# Patient Record
Sex: Female | Born: 1975 | Race: Black or African American | Hispanic: No | Marital: Single | Smoking: Never smoker
Health system: Southern US, Community
[De-identification: ages and names within clinical notes are randomized; demographics above are authoritative.]

## PROBLEM LIST (undated history)

## (undated) ENCOUNTER — Inpatient Hospital Stay (HOSPITAL_COMMUNITY): Payer: Self-pay

## (undated) DIAGNOSIS — G8929 Other chronic pain: Secondary | ICD-10-CM

## (undated) DIAGNOSIS — G43909 Migraine, unspecified, not intractable, without status migrainosus: Secondary | ICD-10-CM

## (undated) DIAGNOSIS — J40 Bronchitis, not specified as acute or chronic: Secondary | ICD-10-CM

## (undated) DIAGNOSIS — E119 Type 2 diabetes mellitus without complications: Secondary | ICD-10-CM

## (undated) DIAGNOSIS — N939 Abnormal uterine and vaginal bleeding, unspecified: Secondary | ICD-10-CM

## (undated) DIAGNOSIS — Z973 Presence of spectacles and contact lenses: Secondary | ICD-10-CM

## (undated) DIAGNOSIS — Z8759 Personal history of other complications of pregnancy, childbirth and the puerperium: Secondary | ICD-10-CM

## (undated) DIAGNOSIS — K219 Gastro-esophageal reflux disease without esophagitis: Secondary | ICD-10-CM

## (undated) HISTORY — PX: ESOPHAGOGASTRODUODENOSCOPY: SHX1529

## (undated) HISTORY — PX: HERNIA REPAIR: SHX51

---

## 1997-11-27 ENCOUNTER — Emergency Department (HOSPITAL_COMMUNITY): Admission: EM | Admit: 1997-11-27 | Discharge: 1997-11-27 | Payer: Self-pay | Admitting: Emergency Medicine

## 1997-12-01 ENCOUNTER — Encounter: Admission: RE | Admit: 1997-12-01 | Discharge: 1998-03-01 | Payer: Self-pay | Admitting: *Deleted

## 1998-01-04 ENCOUNTER — Emergency Department (HOSPITAL_COMMUNITY): Admission: EM | Admit: 1998-01-04 | Discharge: 1998-01-04 | Payer: Self-pay

## 1998-04-27 ENCOUNTER — Encounter
Admission: RE | Admit: 1998-04-27 | Discharge: 1998-05-14 | Payer: Self-pay | Admitting: Physical Medicine & Rehabilitation

## 2005-07-29 ENCOUNTER — Emergency Department (HOSPITAL_COMMUNITY): Admission: EM | Admit: 2005-07-29 | Discharge: 2005-07-29 | Payer: Self-pay | Admitting: Emergency Medicine

## 2006-01-02 HISTORY — PX: TOOTH EXTRACTION: SUR596

## 2008-08-07 ENCOUNTER — Inpatient Hospital Stay (HOSPITAL_COMMUNITY): Admission: AD | Admit: 2008-08-07 | Discharge: 2008-08-07 | Payer: Self-pay | Admitting: Obstetrics and Gynecology

## 2008-08-12 ENCOUNTER — Ambulatory Visit (HOSPITAL_COMMUNITY): Admission: AD | Admit: 2008-08-12 | Discharge: 2008-08-12 | Payer: Self-pay | Admitting: Obstetrics and Gynecology

## 2008-09-25 ENCOUNTER — Inpatient Hospital Stay (HOSPITAL_COMMUNITY): Admission: AD | Admit: 2008-09-25 | Discharge: 2008-09-25 | Payer: Self-pay | Admitting: Obstetrics and Gynecology

## 2008-10-12 ENCOUNTER — Inpatient Hospital Stay (HOSPITAL_COMMUNITY): Admission: AD | Admit: 2008-10-12 | Discharge: 2008-10-12 | Payer: Self-pay | Admitting: Obstetrics and Gynecology

## 2008-11-03 ENCOUNTER — Ambulatory Visit (HOSPITAL_COMMUNITY): Admission: RE | Admit: 2008-11-03 | Discharge: 2008-11-03 | Payer: Self-pay | Admitting: Obstetrics and Gynecology

## 2008-11-18 ENCOUNTER — Ambulatory Visit: Payer: Self-pay | Admitting: Physician Assistant

## 2008-11-18 ENCOUNTER — Inpatient Hospital Stay (HOSPITAL_COMMUNITY): Admission: AD | Admit: 2008-11-18 | Discharge: 2008-11-19 | Payer: Self-pay | Admitting: Obstetrics and Gynecology

## 2009-01-24 ENCOUNTER — Inpatient Hospital Stay (HOSPITAL_COMMUNITY): Admission: AD | Admit: 2009-01-24 | Discharge: 2009-01-24 | Payer: Self-pay | Admitting: Obstetrics and Gynecology

## 2009-02-26 ENCOUNTER — Inpatient Hospital Stay (HOSPITAL_COMMUNITY): Admission: RE | Admit: 2009-02-26 | Discharge: 2009-02-28 | Payer: Self-pay | Admitting: Obstetrics and Gynecology

## 2010-03-20 LAB — URINALYSIS, ROUTINE W REFLEX MICROSCOPIC
Nitrite: NEGATIVE
Protein, ur: NEGATIVE mg/dL
Urobilinogen, UA: 0.2 mg/dL (ref 0.0–1.0)
pH: 6 (ref 5.0–8.0)

## 2010-03-23 LAB — RPR: RPR Ser Ql: NONREACTIVE

## 2010-03-23 LAB — CBC
HCT: 40.3 % (ref 36.0–46.0)
MCHC: 32.5 g/dL (ref 30.0–36.0)
MCV: 81.2 fL (ref 78.0–100.0)
MCV: 82 fL (ref 78.0–100.0)
Platelets: 305 10*3/uL (ref 150–400)
RBC: 4.16 MIL/uL (ref 3.87–5.11)
RDW: 16.7 % — ABNORMAL HIGH (ref 11.5–15.5)
WBC: 12.1 10*3/uL — ABNORMAL HIGH (ref 4.0–10.5)

## 2010-03-23 LAB — BASIC METABOLIC PANEL
BUN: 5 mg/dL — ABNORMAL LOW (ref 6–23)
CO2: 22 mEq/L (ref 19–32)
Creatinine, Ser: 0.62 mg/dL (ref 0.4–1.2)
GFR calc Af Amer: >60 mL/min (ref >60)
Glucose, Bld: 185 mg/dL — ABNORMAL HIGH (ref 70–99)
Potassium: 3.5 mEq/L (ref 3.5–5.1)

## 2010-04-06 LAB — URINALYSIS, ROUTINE W REFLEX MICROSCOPIC
Glucose, UA: NEGATIVE mg/dL
Hgb urine dipstick: NEGATIVE
Ketones, ur: NEGATIVE mg/dL
Protein, ur: NEGATIVE mg/dL
Urobilinogen, UA: 0.2 mg/dL (ref 0.0–1.0)
pH: 5 (ref 5.0–8.0)

## 2010-04-06 LAB — FETAL FIBRONECTIN: Fetal Fibronectin: NEGATIVE

## 2010-04-07 LAB — DIFFERENTIAL
Basophils Absolute: 0.1 10*3/uL (ref 0.0–0.1)
Basophils Relative: 0 % (ref 0–1)
Eosinophils Absolute: 0.3 10*3/uL (ref 0.0–0.7)
Eosinophils Relative: 2 % (ref 0–5)
Monocytes Absolute: 0.8 10*3/uL (ref 0.1–1.0)

## 2010-04-07 LAB — URINALYSIS, ROUTINE W REFLEX MICROSCOPIC
Bilirubin Urine: NEGATIVE
Glucose, UA: NEGATIVE mg/dL
Hgb urine dipstick: NEGATIVE
Protein, ur: NEGATIVE mg/dL
Urobilinogen, UA: 0.2 mg/dL (ref 0.0–1.0)

## 2010-04-07 LAB — CBC
Hemoglobin: 12.3 g/dL (ref 12.0–15.0)
MCHC: 33.3 g/dL (ref 30.0–36.0)
MCV: 82.4 fL (ref 78.0–100.0)
RBC: 4.47 MIL/uL (ref 3.87–5.11)
RDW: 15 % (ref 11.5–15.5)

## 2010-04-07 LAB — COMPREHENSIVE METABOLIC PANEL
ALT: 29 U/L (ref 0–35)
AST: 23 U/L (ref 0–37)
Albumin: 3.2 g/dL — ABNORMAL LOW (ref 3.5–5.2)
Alkaline Phosphatase: 72 U/L (ref 39–117)
Creatinine, Ser: 0.59 mg/dL (ref 0.4–1.2)
GFR calc non Af Amer: >60 mL/min (ref >60)
Glucose, Bld: 90 mg/dL (ref 70–99)

## 2010-04-07 LAB — GC/CHLAMYDIA PROBE AMP, GENITAL
Chlamydia, DNA Probe: NEGATIVE
GC Probe Amp, Genital: NEGATIVE

## 2010-04-07 LAB — WET PREP, GENITAL
Trich, Wet Prep: NONE SEEN
Yeast Wet Prep HPF POC: NONE SEEN

## 2010-04-08 LAB — CBC
HCT: 35.4 % — ABNORMAL LOW (ref 36.0–46.0)
Hemoglobin: 11.9 g/dL — ABNORMAL LOW (ref 12.0–15.0)
MCHC: 33.4 g/dL (ref 30.0–36.0)
MCV: 83.5 fL (ref 78.0–100.0)
Platelets: 387 10*3/uL (ref 150–400)
RBC: 4.24 MIL/uL (ref 3.87–5.11)
RDW: 14.9 % (ref 11.5–15.5)
WBC: 15.8 10*3/uL — ABNORMAL HIGH (ref 4.0–10.5)

## 2010-04-08 LAB — COMPREHENSIVE METABOLIC PANEL
AST: 24 U/L (ref 0–37)
Albumin: 3.2 g/dL — ABNORMAL LOW (ref 3.5–5.2)
Alkaline Phosphatase: 64 U/L (ref 39–117)
BUN: 3 mg/dL — ABNORMAL LOW (ref 6–23)
GFR calc Af Amer: >60 mL/min (ref >60)
Potassium: 4.3 mEq/L (ref 3.5–5.1)
Total Bilirubin: 0.4 mg/dL (ref 0.3–1.2)
Total Protein: 5.9 g/dL — ABNORMAL LOW (ref 6.0–8.3)

## 2010-04-08 LAB — URINE CULTURE
Colony Count: NO GROWTH
Culture: NO GROWTH

## 2010-04-08 LAB — URINALYSIS, ROUTINE W REFLEX MICROSCOPIC
Ketones, ur: NEGATIVE mg/dL
Protein, ur: NEGATIVE mg/dL
Specific Gravity, Urine: 1.01 (ref 1.005–1.030)
Urobilinogen, UA: 0.2 mg/dL (ref 0.0–1.0)
pH: 7 (ref 5.0–8.0)

## 2010-04-08 LAB — WET PREP, GENITAL
Trich, Wet Prep: NONE SEEN
Yeast Wet Prep HPF POC: NONE SEEN

## 2010-04-08 LAB — URINE MICROSCOPIC-ADD ON

## 2010-04-10 LAB — URINALYSIS, ROUTINE W REFLEX MICROSCOPIC
Hgb urine dipstick: NEGATIVE
Nitrite: NEGATIVE
Protein, ur: NEGATIVE mg/dL
Urobilinogen, UA: 0.2 mg/dL (ref 0.0–1.0)

## 2010-04-10 LAB — GC/CHLAMYDIA PROBE AMP, URINE
Chlamydia, Swab/Urine, PCR: POSITIVE — AB
GC Probe Amp, Urine: POSITIVE — AB

## 2010-04-10 LAB — ABO/RH: ABO/RH(D): B POS

## 2010-04-10 LAB — HCG, QUANTITATIVE, PREGNANCY: hCG, Beta Chain, Quant, S: 56276 m[IU]/mL — ABNORMAL HIGH (ref <5)

## 2010-04-10 LAB — CBC
MCV: 83.4 fL (ref 78.0–100.0)
Platelets: 384 10*3/uL (ref 150–400)
WBC: 15.8 10*3/uL — ABNORMAL HIGH (ref 4.0–10.5)

## 2010-04-10 LAB — WET PREP, GENITAL: Yeast Wet Prep HPF POC: NONE SEEN

## 2012-05-22 ENCOUNTER — Emergency Department (HOSPITAL_BASED_OUTPATIENT_CLINIC_OR_DEPARTMENT_OTHER)
Admission: EM | Admit: 2012-05-22 | Discharge: 2012-05-22 | Disposition: A | Discharge status: Home / Self Care | Payer: No Typology Code available for payment source | Attending: Emergency Medicine | Admitting: Emergency Medicine

## 2012-05-22 ENCOUNTER — Encounter (HOSPITAL_BASED_OUTPATIENT_CLINIC_OR_DEPARTMENT_OTHER): Payer: Self-pay | Admitting: *Deleted

## 2012-05-22 DIAGNOSIS — Y9389 Activity, other specified: Secondary | ICD-10-CM | POA: Insufficient documentation

## 2012-05-22 DIAGNOSIS — R110 Nausea: Secondary | ICD-10-CM | POA: Insufficient documentation

## 2012-05-22 DIAGNOSIS — G43909 Migraine, unspecified, not intractable, without status migrainosus: Secondary | ICD-10-CM | POA: Insufficient documentation

## 2012-05-22 DIAGNOSIS — Z043 Encounter for examination and observation following other accident: Secondary | ICD-10-CM | POA: Insufficient documentation

## 2012-05-22 DIAGNOSIS — Y92410 Unspecified street and highway as the place of occurrence of the external cause: Secondary | ICD-10-CM | POA: Insufficient documentation

## 2012-05-22 HISTORY — DX: Migraine, unspecified, not intractable, without status migrainosus: G43.909

## 2012-05-22 MED ORDER — KETOROLAC TROMETHAMINE 60 MG/2ML IM SOLN
60.0000 mg | Freq: Once | INTRAMUSCULAR | Status: AC
  Administered 2012-05-22: 60 mg via INTRAMUSCULAR
  Filled 2012-05-22: qty 2

## 2012-05-22 MED ORDER — METOCLOPRAMIDE HCL 10 MG PO TABS: 10.0000 mg | ORAL_TABLET | Freq: Four times a day (QID) | ORAL | Status: DC | PRN

## 2012-05-22 MED ORDER — TRAMADOL HCL 50 MG PO TABS
50.0000 mg | ORAL_TABLET | Freq: Once | ORAL | Status: AC
  Administered 2012-05-22: 50 mg via ORAL
  Filled 2012-05-22: qty 1

## 2012-05-22 MED ORDER — METOCLOPRAMIDE HCL 10 MG PO TABS
10.0000 mg | ORAL_TABLET | Freq: Once | ORAL | Status: AC
  Administered 2012-05-22: 10 mg via ORAL
  Filled 2012-05-22: qty 1

## 2012-05-22 MED ORDER — TRAMADOL HCL 50 MG PO TABS: 50.0000 mg | ORAL_TABLET | Freq: Four times a day (QID) | ORAL | Status: DC | PRN

## 2012-05-22 MED ORDER — IBUPROFEN 600 MG PO TABS: 600.0000 mg | ORAL_TABLET | Freq: Four times a day (QID) | ORAL | Status: DC | PRN

## 2012-05-22 NOTE — ED Notes (Signed)
Pt ambulatory to restroom without difficulty.
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
## 2013-01-22 ENCOUNTER — Ambulatory Visit
Admission: RE | Admit: 2013-01-22 | Discharge: 2013-01-22 | Disposition: A | Discharge status: Home / Self Care | Payer: Worker's Compensation | Source: Ambulatory Visit | Attending: Occupational Medicine | Admitting: Occupational Medicine

## 2013-01-22 ENCOUNTER — Other Ambulatory Visit: Payer: Self-pay | Admitting: Occupational Medicine

## 2013-01-22 DIAGNOSIS — W19XXXA Unspecified fall, initial encounter: Secondary | ICD-10-CM

## 2013-03-09 ENCOUNTER — Encounter (HOSPITAL_BASED_OUTPATIENT_CLINIC_OR_DEPARTMENT_OTHER): Payer: Self-pay | Admitting: Emergency Medicine

## 2013-03-09 ENCOUNTER — Emergency Department (HOSPITAL_BASED_OUTPATIENT_CLINIC_OR_DEPARTMENT_OTHER): Payer: Medicaid Other

## 2013-03-09 ENCOUNTER — Emergency Department (HOSPITAL_BASED_OUTPATIENT_CLINIC_OR_DEPARTMENT_OTHER)
Admission: EM | Admit: 2013-03-09 | Discharge: 2013-03-09 | Disposition: A | Discharge status: Home / Self Care | Payer: Medicaid Other | Attending: Emergency Medicine | Admitting: Emergency Medicine

## 2013-03-09 DIAGNOSIS — R109 Unspecified abdominal pain: Secondary | ICD-10-CM | POA: Insufficient documentation

## 2013-03-09 DIAGNOSIS — O9989 Other specified diseases and conditions complicating pregnancy, childbirth and the puerperium: Secondary | ICD-10-CM | POA: Insufficient documentation

## 2013-03-09 DIAGNOSIS — M549 Dorsalgia, unspecified: Secondary | ICD-10-CM | POA: Insufficient documentation

## 2013-03-09 DIAGNOSIS — G43909 Migraine, unspecified, not intractable, without status migrainosus: Secondary | ICD-10-CM | POA: Insufficient documentation

## 2013-03-09 DIAGNOSIS — R5383 Other fatigue: Secondary | ICD-10-CM

## 2013-03-09 DIAGNOSIS — R5381 Other malaise: Secondary | ICD-10-CM | POA: Insufficient documentation

## 2013-03-09 DIAGNOSIS — O26899 Other specified pregnancy related conditions, unspecified trimester: Secondary | ICD-10-CM

## 2013-03-09 LAB — CBC WITH DIFFERENTIAL/PLATELET
Basophils Absolute: 0.1 10*3/uL (ref 0.0–0.1)
Basophils Relative: 0 % (ref 0–1)
EOS ABS: 0.5 10*3/uL (ref 0.0–0.7)
EOS PCT: 4 % (ref 0–5)
HCT: 35.9 % — ABNORMAL LOW (ref 36.0–46.0)
HEMOGLOBIN: 12 g/dL (ref 12.0–15.0)
LYMPHS ABS: 3.1 10*3/uL (ref 0.7–4.0)
Lymphocytes Relative: 25 % (ref 12–46)
MCH: 26.2 pg (ref 26.0–34.0)
MCHC: 33.4 g/dL (ref 30.0–36.0)
MCV: 78.4 fL (ref 78.0–100.0)
MONOS PCT: 8 % (ref 3–12)
Monocytes Absolute: 1 10*3/uL (ref 0.1–1.0)
Neutro Abs: 7.8 10*3/uL — ABNORMAL HIGH (ref 1.7–7.7)
Neutrophils Relative %: 63 % (ref 43–77)
Platelets: 430 10*3/uL — ABNORMAL HIGH (ref 150–400)
RBC: 4.58 MIL/uL (ref 3.87–5.11)
RDW: 15 % (ref 11.5–15.5)
WBC: 12.5 10*3/uL — ABNORMAL HIGH (ref 4.0–10.5)

## 2013-03-09 LAB — URINALYSIS, ROUTINE W REFLEX MICROSCOPIC
BILIRUBIN URINE: NEGATIVE
Glucose, UA: NEGATIVE mg/dL
HGB URINE DIPSTICK: NEGATIVE
KETONES UR: NEGATIVE mg/dL
Leukocytes, UA: NEGATIVE
NITRITE: NEGATIVE
PH: 5.5 (ref 5.0–8.0)
Protein, ur: NEGATIVE mg/dL
SPECIFIC GRAVITY, URINE: 1.015 (ref 1.005–1.030)
Urobilinogen, UA: 0.2 mg/dL (ref 0.0–1.0)

## 2013-03-09 LAB — PREGNANCY, URINE: PREG TEST UR: POSITIVE — AB

## 2013-03-09 LAB — HCG, QUANTITATIVE, PREGNANCY: hCG, Beta Chain, Quant, S: 14621 m[IU]/mL — ABNORMAL HIGH (ref <5)

## 2013-03-09 LAB — WET PREP, GENITAL
TRICH WET PREP: NONE SEEN
YEAST WET PREP: NONE SEEN

## 2013-03-09 MED ORDER — DOXYLAMINE-PYRIDOXINE 10-10 MG PO TBEC: 2.0000 | DELAYED_RELEASE_TABLET | Freq: Every day | ORAL | Status: DC

## 2013-03-09 NOTE — ED Provider Notes (Signed)
CSN: [id number]     Arrival date & time 03/09/13  1156 History   First MD Initiated Contact with Patient 03/09/13 1328     Chief Complaint  Patient presents with  . Abdominal Cramping     (Consider location/radiation/quality/duration/timing/severity/associated sxs/prior Treatment) Patient is a 38 y.o. female presenting with cramps. The history is provided by the patient.  Abdominal Cramping This is a new problem. The current episode started yesterday. The problem occurs constantly. The problem has been gradually worsening. Associated symptoms include abdominal pain and fatigue. Pertinent negatives include no anorexia, change in bowel habit, chest pain, coughing, fever, headaches, nausea, rash, sore throat, swollen glands or vomiting. Nothing aggravates the symptoms. She has tried nothing for the symptoms.   Laura Kramer is a 38 y.o. female who presents to the ED with abdominal pain that is cramping in nature. She reports that the pain radiates to her lower back.G3, P2,0,0,2,  LMP 01/16/2013. Current sex partner x 2 years. No birth control. No history of STI. Last Pap Smear less than one year and was normal with Dr. Willis Modena.  Past Medical History  Diagnosis Date  . Migraine    Past Surgical History  Procedure Laterality Date  . Cesarean section     No family history on file. History  Substance Use Topics  . Smoking status: Never Smoker   . Smokeless tobacco: Not on file  . Alcohol Use: No   OB History   Grav Para Term Preterm Abortions TAB SAB Ect Mult Living                 Review of Systems  Constitutional: Positive for fatigue. Negative for fever.  HENT: Negative.  Negative for sore throat.   Eyes: Negative for visual disturbance.  Respiratory: Negative for cough and shortness of breath.   Cardiovascular: Negative for chest pain.  Gastrointestinal: Positive for abdominal pain. Negative for nausea, vomiting, anorexia and change in bowel habit.  Genitourinary: Negative  for dysuria, urgency and frequency.  Musculoskeletal: Positive for back pain.  Skin: Negative for rash.  Neurological: Negative for light-headedness and headaches.  Psychiatric/Behavioral: Negative for confusion. The patient is not nervous/anxious.       Allergies  Ciprofloxacin  Home Medications   Current Outpatient Rx  Name  Route  Sig  Dispense  Refill  . ibuprofen (ADVIL,MOTRIN) 600 MG tablet   Oral   Take 1 tablet (600 mg total) by mouth every 6 (six) hours as needed for pain.   30 tablet   0   . metoCLOPramide (REGLAN) 10 MG tablet   Oral   Take 1 tablet (10 mg total) by mouth every 6 (six) hours as needed (nausea/headache).   6 tablet   0   . traMADol (ULTRAM) 50 MG tablet   Oral   Take 1 tablet (50 mg total) by mouth every 6 (six) hours as needed for pain.   15 tablet   0    BP 131/72  Pulse 66  Temp(Src) 98.5 F (36.9 C) (Oral)  Resp 18  Ht 5\' 3"  (1.6 m)  Wt 240 lb (108.863 kg)  BMI 42.52 kg/m2  SpO2 100%  LMP 01/16/2013 Physical Exam  Nursing note and vitals reviewed. Constitutional: She is oriented to person, place, and time. She appears well-developed and well-nourished.  HENT:  Head: Normocephalic.  Eyes: Conjunctivae and EOM are normal.  Neck: Neck supple.  Cardiovascular: Normal rate.   Pulmonary/Chest: Effort normal.  Abdominal: Soft. Bowel sounds are normal. There is  no tenderness.  Genitourinary:  External genitalia without lesions. Frothy discharge vaginal vault. Cervix long, closed, no CMT, no adnexal tenderness or mass palpated. Uterus slightly enlarged.   Musculoskeletal: Normal range of motion.  Neurological: She is alert and oriented to person, place, and time. No cranial nerve deficit.  Skin: Skin is warm and dry.  Psychiatric: She has a normal mood and affect. Her behavior is normal.     Results for orders placed during the hospital encounter of 03/09/13 (from the past 24 hour(s))  PREGNANCY, URINE     Status: Abnormal    Collection Time    03/09/13  1:00 PM      Result Value Ref Range   Preg Test, Ur POSITIVE (*) NEGATIVE  URINALYSIS, ROUTINE W REFLEX MICROSCOPIC     Status: None   Collection Time    03/09/13  1:00 PM      Result Value Ref Range   Color, Urine YELLOW  YELLOW   APPearance CLEAR  CLEAR   Specific Gravity, Urine 1.015  1.005 - 1.030   pH 5.5  5.0 - 8.0   Glucose, UA NEGATIVE  NEGATIVE mg/dL   Hgb urine dipstick NEGATIVE  NEGATIVE   Bilirubin Urine NEGATIVE  NEGATIVE   Ketones, ur NEGATIVE  NEGATIVE mg/dL   Protein, ur NEGATIVE  NEGATIVE mg/dL   Urobilinogen, UA 0.2  0.0 - 1.0 mg/dL   Nitrite NEGATIVE  NEGATIVE   Leukocytes, UA NEGATIVE  NEGATIVE  CBC WITH DIFFERENTIAL     Status: Abnormal   Collection Time    03/09/13  1:45 PM      Result Value Ref Range   WBC 12.5 (*) 4.0 - 10.5 K/uL   RBC 4.58  3.87 - 5.11 MIL/uL   Hemoglobin 12.0  12.0 - 15.0 g/dL   HCT 35.9 (*) 36.0 - 46.0 %   MCV 78.4  78.0 - 100.0 fL   MCH 26.2  26.0 - 34.0 pg   MCHC 33.4  30.0 - 36.0 g/dL   RDW 15.0  11.5 - 15.5 %   Platelets 430 (*) 150 - 400 K/uL   Neutrophils Relative % 63  43 - 77 %   Neutro Abs 7.8 (*) 1.7 - 7.7 K/uL   Lymphocytes Relative 25  12 - 46 %   Lymphs Abs 3.1  0.7 - 4.0 K/uL   Monocytes Relative 8  3 - 12 %   Monocytes Absolute 1.0  0.1 - 1.0 K/uL   Eosinophils Relative 4  0 - 5 %   Eosinophils Absolute 0.5  0.0 - 0.7 K/uL   Basophils Relative 0  0 - 1 %   Basophils Absolute 0.1  0.0 - 0.1 K/uL  HCG, QUANTITATIVE, PREGNANCY     Status: Abnormal   Collection Time    03/09/13  1:45 PM      Result Value Ref Range   hCG, Beta Chain, Quant, S 14621 (*) <5 mIU/mL  WET PREP, GENITAL     Status: Abnormal   Collection Time    03/09/13  1:53 PM      Result Value Ref Range   Yeast Wet Prep HPF POC NONE SEEN  NONE SEEN   Trich, Wet Prep NONE SEEN  NONE SEEN   Clue Cells Wet Prep HPF POC FEW (*) NONE SEEN   WBC, Wet Prep HPF POC FEW (*) NONE SEEN    ED Course  Procedures  US Ob Comp  Less 14 Wks  03/09/2013   CLINICAL  DATA:  Abdominal cramping.  Beta HCG level 14,621  EXAM: OBSTETRIC <14 WK [place] AND TRANSVAGINAL OB US  TECHNIQUE: Both transabdominal and transvaginal ultrasound examinations were performed for complete evaluation of the gestation as well as the maternal uterus, adnexal regions, and pelvic cul-de-sac. Transvaginal technique was performed to assess early pregnancy.  COMPARISON:  None.  FINDINGS: Intrauterine gestational sac: Visualized/normal in shape.  Yolk sac:  Yes  Embryo:  Yes  Cardiac Activity: Subtle cardiac flicker evident.  Heart Rate:  127 bpm  CRL:   5  mm   6 w 2 d                  [place] EDC: 10/23/2013  Maternal uterus/adnexae: No uterine mass. No subchorionic hemorrhage. Normal cervix.  Left ovary normal in size. Small echogenic focus measuring 10 mm within the left ovary, nonspecific. Right ovary not visualized. No adnexal masses. Heterogeneous area of echogenicity posterior to the uterus, which may be bowel. Trace pelvic free fluid.  IMPRESSION: 1. Single live intrauterine pregnancy with a measured gestational age of [redacted] weeks and 2 days. No emergent maternal or fetal complication.   Electronically Signed   By: Lajean Manes M.D.   On: 03/09/2013 15:50   US Ob Transvaginal  03/09/2013   CLINICAL DATA:  Abdominal cramping.  Beta HCG level 14,621  EXAM: OBSTETRIC <14 WK [place] AND TRANSVAGINAL OB US  TECHNIQUE: Both transabdominal and transvaginal ultrasound examinations were performed for complete evaluation of the gestation as well as the maternal uterus, adnexal regions, and pelvic cul-de-sac. Transvaginal technique was performed to assess early pregnancy.  COMPARISON:  None.  FINDINGS: Intrauterine gestational sac: Visualized/normal in shape.  Yolk sac:  Yes  Embryo:  Yes  Cardiac Activity: Subtle cardiac flicker evident.  Heart Rate:  127 bpm  CRL:   5  mm   6 w 2 d                  [place] EDC: 10/23/2013  Maternal uterus/adnexae: No uterine mass. No subchorionic hemorrhage.  Normal cervix.  Left ovary normal in size. Small echogenic focus measuring 10 mm within the left ovary, nonspecific. Right ovary not visualized. No adnexal masses. Heterogeneous area of echogenicity posterior to the uterus, which may be bowel. Trace pelvic free fluid.  IMPRESSION: 1. Single live intrauterine pregnancy with a measured gestational age of 101 weeks and 2 days. No emergent maternal or fetal complication.   Electronically Signed   By: Lajean Manes M.D.   On: 03/09/2013 15:50    MDM  38 y.o. female with abdominal pain in early pregnancy. Viable IUP documented on ultrasound. I have reviewed this patient's vital signs, nurses notes, appropriate labs and imaging.  I have discussed findings and plan of care with the patient and she voices understanding. Stable for discharge to start prenatal. No ectopic pregnancy, normal placenta. She will follow up at [facility] for any pregnancy related problems.     [place], [place] 03/09/13 1610

## 2013-03-09 NOTE — ED Notes (Signed)
abd and back pain   Onset last pm   Denies urinary sx
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
## 2013-03-09 NOTE — Discharge Instructions (Signed)
Your ultrasound today shows that you are 6 weeks and 2 days pregnant. There is a heart beat seen on the baby. You will need to start your prenatal care. If you have problems with the pregnancy go to [facility].

## 2013-03-09 NOTE — ED Notes (Signed)
Pelvic cart is at the bedside set up and ready for the doctor to use. 

## 2013-03-10 LAB — GC/CHLAMYDIA PROBE AMP
CT Probe RNA: NEGATIVE
GC PROBE AMP APTIMA: NEGATIVE

## 2013-03-10 NOTE — ED Provider Notes (Signed)
Medical screening examination/treatment/procedure(s) were performed by non-physician practitioner and as supervising physician I was immediately available for consultation/collaboration.   EKG Interpretation None        Malvin Johns, MD 03/10/13 [phone]

## 2013-07-17 ENCOUNTER — Encounter (HOSPITAL_BASED_OUTPATIENT_CLINIC_OR_DEPARTMENT_OTHER): Payer: Self-pay | Admitting: Emergency Medicine

## 2013-07-17 ENCOUNTER — Emergency Department (HOSPITAL_BASED_OUTPATIENT_CLINIC_OR_DEPARTMENT_OTHER): Payer: BC Managed Care – PPO

## 2013-07-17 ENCOUNTER — Emergency Department (HOSPITAL_BASED_OUTPATIENT_CLINIC_OR_DEPARTMENT_OTHER)
Admission: EM | Admit: 2013-07-17 | Discharge: 2013-07-17 | Disposition: A | Discharge status: Home / Self Care | Payer: BC Managed Care – PPO | Attending: Emergency Medicine | Admitting: Emergency Medicine

## 2013-07-17 DIAGNOSIS — B9689 Other specified bacterial agents as the cause of diseases classified elsewhere: Secondary | ICD-10-CM | POA: Insufficient documentation

## 2013-07-17 DIAGNOSIS — Z3202 Encounter for pregnancy test, result negative: Secondary | ICD-10-CM | POA: Diagnosis not present

## 2013-07-17 DIAGNOSIS — R000 Tachycardia, unspecified: Secondary | ICD-10-CM | POA: Diagnosis not present

## 2013-07-17 DIAGNOSIS — N760 Acute vaginitis: Secondary | ICD-10-CM | POA: Diagnosis not present

## 2013-07-17 DIAGNOSIS — A499 Bacterial infection, unspecified: Secondary | ICD-10-CM | POA: Diagnosis not present

## 2013-07-17 DIAGNOSIS — G43809 Other migraine, not intractable, without status migrainosus: Secondary | ICD-10-CM | POA: Insufficient documentation

## 2013-07-17 DIAGNOSIS — R509 Fever, unspecified: Secondary | ICD-10-CM

## 2013-07-17 DIAGNOSIS — R002 Palpitations: Secondary | ICD-10-CM | POA: Insufficient documentation

## 2013-07-17 DIAGNOSIS — R0602 Shortness of breath: Secondary | ICD-10-CM | POA: Insufficient documentation

## 2013-07-17 LAB — WET PREP, GENITAL
Trich, Wet Prep: NONE SEEN
Yeast Wet Prep HPF POC: NONE SEEN

## 2013-07-17 LAB — URINALYSIS, ROUTINE W REFLEX MICROSCOPIC
BILIRUBIN URINE: NEGATIVE
GLUCOSE, UA: NEGATIVE mg/dL
Hgb urine dipstick: NEGATIVE
KETONES UR: NEGATIVE mg/dL
LEUKOCYTES UA: NEGATIVE
Nitrite: NEGATIVE
PH: 6.5 (ref 5.0–8.0)
Protein, ur: 30 mg/dL — AB
Specific Gravity, Urine: 1.012 (ref 1.005–1.030)
Urobilinogen, UA: 0.2 mg/dL (ref 0.0–1.0)

## 2013-07-17 LAB — COMPREHENSIVE METABOLIC PANEL
ALBUMIN: 4.5 g/dL (ref 3.5–5.2)
ALT: 9 U/L (ref 0–35)
ANION GAP: 15 (ref 5–15)
AST: 12 U/L (ref 0–37)
Alkaline Phosphatase: 75 U/L (ref 39–117)
BUN: 7 mg/dL (ref 6–23)
CALCIUM: 10.1 mg/dL (ref 8.4–10.5)
CO2: 23 mEq/L (ref 19–32)
CREATININE: 1 mg/dL (ref 0.50–1.10)
Chloride: 99 mEq/L (ref 96–112)
GFR calc Af Amer: 82 mL/min — ABNORMAL LOW (ref >90)
GFR calc non Af Amer: 71 mL/min — ABNORMAL LOW (ref >90)
Glucose, Bld: 111 mg/dL — ABNORMAL HIGH (ref 70–99)
Potassium: 3.9 mEq/L (ref 3.7–5.3)
Sodium: 137 mEq/L (ref 137–147)
Total Bilirubin: 0.4 mg/dL (ref 0.3–1.2)
Total Protein: 7.9 g/dL (ref 6.0–8.3)

## 2013-07-17 LAB — I-STAT CG4 LACTIC ACID, ED: Lactic Acid, Venous: 0.94 mmol/L (ref 0.5–2.2)

## 2013-07-17 LAB — CBC WITH DIFFERENTIAL/PLATELET
BASOS ABS: 0.1 10*3/uL (ref 0.0–0.1)
BASOS PCT: 0 % (ref 0–1)
EOS PCT: 1 % (ref 0–5)
Eosinophils Absolute: 0.1 10*3/uL (ref 0.0–0.7)
HEMATOCRIT: 36.6 % (ref 36.0–46.0)
Hemoglobin: 12.5 g/dL (ref 12.0–15.0)
Lymphocytes Relative: 12 % (ref 12–46)
Lymphs Abs: 1.8 10*3/uL (ref 0.7–4.0)
MCH: 26.5 pg (ref 26.0–34.0)
MCHC: 34.2 g/dL (ref 30.0–36.0)
MCV: 77.5 fL — AB (ref 78.0–100.0)
MONO ABS: 1 10*3/uL (ref 0.1–1.0)
Monocytes Relative: 7 % (ref 3–12)
Neutro Abs: 11.9 10*3/uL — ABNORMAL HIGH (ref 1.7–7.7)
Neutrophils Relative %: 80 % — ABNORMAL HIGH (ref 43–77)
Platelets: 465 10*3/uL — ABNORMAL HIGH (ref 150–400)
RBC: 4.72 MIL/uL (ref 3.87–5.11)
RDW: 14.6 % (ref 11.5–15.5)
WBC: 14.8 10*3/uL — ABNORMAL HIGH (ref 4.0–10.5)

## 2013-07-17 LAB — PREGNANCY, URINE: Preg Test, Ur: NEGATIVE

## 2013-07-17 LAB — URINE MICROSCOPIC-ADD ON

## 2013-07-17 MED ORDER — METOCLOPRAMIDE HCL 5 MG/ML IJ SOLN
10.0000 mg | Freq: Once | INTRAMUSCULAR | Status: AC
  Administered 2013-07-17: 10 mg via INTRAVENOUS
  Filled 2013-07-17: qty 2

## 2013-07-17 MED ORDER — METRONIDAZOLE 500 MG PO TABS: 500.0000 mg | ORAL_TABLET | Freq: Two times a day (BID) | ORAL | Status: DC

## 2013-07-17 MED ORDER — IBUPROFEN 800 MG PO TABS
800.0000 mg | ORAL_TABLET | Freq: Once | ORAL | Status: AC
  Administered 2013-07-17: 800 mg via ORAL
  Filled 2013-07-17: qty 1

## 2013-07-17 MED ORDER — ACETAMINOPHEN 325 MG PO TABS
650.0000 mg | ORAL_TABLET | Freq: Once | ORAL | Status: AC
  Administered 2013-07-17: 650 mg via ORAL
  Filled 2013-07-17: qty 2

## 2013-07-17 MED ORDER — SODIUM CHLORIDE 0.9 % IV BOLUS (SEPSIS)
1000.0000 mL | Freq: Once | INTRAVENOUS | Status: AC
  Administered 2013-07-17: 1000 mL via INTRAVENOUS

## 2013-07-17 MED ORDER — DIPHENHYDRAMINE HCL 50 MG/ML IJ SOLN
25.0000 mg | Freq: Once | INTRAMUSCULAR | Status: AC
  Administered 2013-07-17: 25 mg via INTRAVENOUS
  Filled 2013-07-17: qty 1

## 2013-07-17 NOTE — Discharge Instructions (Signed)
Fever, Adult A fever is a higher than normal body temperature. In an adult, an oral temperature around 98.6 F (37 C) is considered normal. A temperature of 100.4 F (38 C) or higher is generally considered a fever. Mild or moderate fevers generally have no long-term effects and often do not require treatment. Extreme fever (greater than or equal to 106 F or 41.1 C) can cause seizures. The sweating that may occur with repeated or prolonged fever may cause dehydration. Elderly people can develop confusion during a fever. A measured temperature can vary with:  Age.  Time of day.  Method of measurement (mouth, underarm, rectal, or ear). The fever is confirmed by taking a temperature with a thermometer. Temperatures can be taken different ways. Some methods are accurate and some are not.  An oral temperature is used most commonly. Electronic thermometers are fast and accurate.  An ear temperature will only be accurate if the thermometer is positioned as recommended by the manufacturer.  A rectal temperature is accurate and done for those adults who have a condition where an oral temperature cannot be taken.  An underarm (axillary) temperature is not accurate and not recommended. Fever is a symptom, not a disease.  CAUSES   Infections commonly cause fever.  Some noninfectious causes for fever include:  Some arthritis conditions.  Some thyroid or adrenal gland conditions.  Some immune system conditions.  Some types of cancer.  A medicine reaction.  High doses of certain street drugs such as methamphetamine.  Dehydration.  Exposure to high outside or room temperatures.  Occasionally, the source of a fever cannot be determined. This is sometimes called a "fever of unknown origin" (FUO).  Some situations may lead to a temporary rise in body temperature that may go away on its own. Examples are:  Childbirth.  Surgery.  Intense exercise. HOME CARE INSTRUCTIONS   Take  appropriate medicines for fever. Follow dosing instructions carefully. If you use acetaminophen to reduce the fever, be careful to avoid taking other medicines that also contain acetaminophen. Do not take aspirin for a fever if you are younger than age 60. There is an association with Reye's syndrome. Reye's syndrome is a rare but potentially deadly disease.  If an infection is present and antibiotics have been prescribed, take them as directed. Finish them even if you start to feel better.  Rest as needed.  Maintain an adequate fluid intake. To prevent dehydration during an illness with prolonged or recurrent fever, you may need to drink extra fluid.Drink enough fluids to keep your urine clear or pale yellow.  Sponging or bathing with room temperature water may help reduce body temperature. Do not use ice water or alcohol sponge baths.  Dress comfortably, but do not over-bundle. SEEK MEDICAL CARE IF:   You are unable to keep fluids down.  You develop vomiting or diarrhea.  You are not feeling at least partly better after 3 days.  You develop new symptoms or problems. SEEK IMMEDIATE MEDICAL CARE IF:   You have shortness of breath or trouble breathing.  You develop excessive weakness.  You are dizzy or you faint.  You are extremely thirsty or you are making little or no urine.  You develop new pain that was not there before (such as in the head, neck, chest, back, or abdomen).  You have persistant vomiting and diarrhea for more than 1 to 2 days.  You develop a stiff neck or your eyes become sensitive to light.  You develop a  skin rash.  You have a fever or persistent symptoms for more than 2 to 3 days.  You have a fever and your symptoms suddenly get worse. MAKE SURE YOU:   Understand these instructions.  Will watch your condition.  Will get help right away if you are not doing well or get worse. Document Released: 06/14/2000 Document Revised: 03/13/2011 Document  Reviewed: 10/20/2010 [facility] Patient Information 2015 [place], [place]. This information is not intended to replace advice given to you by your health care provider. Make sure you discuss any questions you have with your health care provider.   Palpitations A palpitation is the feeling that your heartbeat is irregular or is faster than normal. It may feel like your heart is fluttering or skipping a beat. Palpitations are usually not a serious problem. However, in some cases, you may need further medical evaluation. CAUSES  Palpitations can be caused by:  Smoking.  Caffeine or other stimulants, such as diet pills or energy drinks.  Alcohol.  Stress and anxiety.  Strenuous physical activity.  Fatigue.  Certain medicines.  Heart disease, especially if you have a history of irregular heart rhythms (arrhythmias), such as atrial fibrillation, atrial flutter, or supraventricular tachycardia.  An improperly working pacemaker or defibrillator. DIAGNOSIS  To find the cause of your palpitations, your health care provider will take your medical history and perform a physical exam. Your health care provider may also have you take a test called an ambulatory electrocardiogram (ECG). An ECG records your heartbeat patterns over a 24-hour period. You may also have other tests, such as:  Transthoracic echocardiogram (TTE). During echocardiography, sound waves are used to evaluate how blood flows through your heart.  Transesophageal echocardiogram (TEE).  Cardiac monitoring. This allows your health care provider to monitor your heart rate and rhythm in real time.  Holter monitor. This is a portable device that records your heartbeat and can help diagnose heart arrhythmias. It allows your health care provider to track your heart activity for several days, if needed.  Stress tests by exercise or by giving medicine that makes the heart beat faster. TREATMENT  Treatment of palpitations depends on the  cause of your symptoms and can vary greatly. Most cases of palpitations do not require any treatment other than time, relaxation, and monitoring your symptoms. Other causes, such as atrial fibrillation, atrial flutter, or supraventricular tachycardia, usually require further treatment. HOME CARE INSTRUCTIONS   Avoid:  Caffeinated coffee, tea, soft drinks, diet pills, and energy drinks.  Chocolate.  Alcohol.  Stop smoking if you smoke.  Reduce your stress and anxiety. Things that can help you relax include:  A method of controlling things in your body, such as your heartbeats, with your mind (biofeedback).  Yoga.  Meditation.  Physical activity such as swimming, jogging, or walking.  Get plenty of rest and sleep. SEEK MEDICAL CARE IF:   You continue to have a fast or irregular heartbeat beyond 24 hours.  Your palpitations occur more often. SEEK IMMEDIATE MEDICAL CARE IF:  You have chest pain or shortness of breath.  You have a severe headache.  You feel dizzy or you faint. MAKE SURE YOU:  Understand these instructions.  Will watch your condition.  Will get help right away if you are not doing well or get worse. Document Released: 12/17/1999 Document Revised: 12/24/2012 Document Reviewed: 02/17/2011 [facility] Patient Information 2015 [place], [place]. This information is not intended to replace advice given to you by your health care provider. Make sure you discuss  any questions you have with your health care provider.   Migraine Headache A migraine headache is an intense, throbbing pain on one or both sides of your head. A migraine can last for 30 minutes to several hours. CAUSES  The exact cause of a migraine headache is not always known. However, a migraine may be caused when nerves in the brain become irritated and release chemicals that cause inflammation. This causes pain. Certain things may also trigger migraines, such  as:  Alcohol.  Smoking.  Stress.  Menstruation.  Aged cheeses.  Foods or drinks that contain nitrates, glutamate, aspartame, or tyramine.  Lack of sleep.  Chocolate.  Caffeine.  Hunger.  Physical exertion.  Fatigue.  Medicines used to treat chest pain (nitroglycerine), birth control pills, estrogen, and some blood pressure medicines. SIGNS AND SYMPTOMS  Pain on one or both sides of your head.  Pulsating or throbbing pain.  Severe pain that prevents daily activities.  Pain that is aggravated by any physical activity.  Nausea, vomiting, or both.  Dizziness.  Pain with exposure to bright lights, loud noises, or activity.  General sensitivity to bright lights, loud noises, or smells. Before you get a migraine, you may get warning signs that a migraine is coming (aura). An aura may include:  Seeing flashing lights.  Seeing bright spots, halos, or zig-zag lines.  Having tunnel vision or blurred vision.  Having feelings of numbness or tingling.  Having trouble talking.  Having muscle weakness. DIAGNOSIS  A migraine headache is often diagnosed based on:  Symptoms.  Physical exam.  A CT scan or MRI of your head. These imaging tests cannot diagnose migraines, but they can help rule out other causes of headaches. TREATMENT Medicines may be given for pain and nausea. Medicines can also be given to help prevent recurrent migraines.  HOME CARE INSTRUCTIONS  Only take over-the-counter or prescription medicines for pain or discomfort as directed by your health care provider. The use of long-term narcotics is not recommended.  Lie down in a dark, quiet room when you have a migraine.  Keep a journal to find out what may trigger your migraine headaches. For example, write down:  What you eat and drink.  How much sleep you get.  Any change to your diet or medicines.  Limit alcohol consumption.  Quit smoking if you smoke.  Get 7-9 hours of sleep, or as  recommended by your health care provider.  Limit stress.  Keep lights dim if bright lights bother you and make your migraines worse. SEEK IMMEDIATE MEDICAL CARE IF:   Your migraine becomes severe.  You have a fever.  You have a stiff neck.  You have vision loss.  You have muscular weakness or loss of muscle control.  You start losing your balance or have trouble walking.  You feel faint or pass out.  You have severe symptoms that are different from your first symptoms. MAKE SURE YOU:   Understand these instructions.  Will watch your condition.  Will get help right away if you are not doing well or get worse. Document Released: 12/19/2004 Document Revised: 10/09/2012 Document Reviewed: 08/26/2012 [facility] Patient Information 2015 [place], [place]. This information is not intended to replace advice given to you by your health care provider. Make sure you discuss any questions you have with your health care provider.   Bacterial Vaginosis Bacterial vaginosis is a vaginal infection that occurs when the normal balance of bacteria in the vagina is disrupted. It results from an overgrowth  of certain bacteria. This is the most common vaginal infection in women of childbearing age. Treatment is important to prevent complications, especially in pregnant women, as it can cause a premature delivery. CAUSES  Bacterial vaginosis is caused by an increase in harmful bacteria that are normally present in smaller amounts in the vagina. Several different kinds of bacteria can cause bacterial vaginosis. However, the reason that the condition develops is not fully understood. RISK FACTORS Certain activities or behaviors can put you at an increased risk of developing bacterial vaginosis, including:  Having a new sex partner or multiple sex partners.  Douching.  Using an intrauterine device (IUD) for contraception. Women do not get bacterial vaginosis from toilet seats, bedding, swimming pools,  or contact with objects around them. SIGNS AND SYMPTOMS  Some women with bacterial vaginosis have no signs or symptoms. Common symptoms include:  Grey vaginal discharge.  A fishlike odor with discharge, especially after sexual intercourse.  Itching or burning of the vagina and vulva.  Burning or pain with urination. DIAGNOSIS  Your health care provider will take a medical history and examine the vagina for signs of bacterial vaginosis. A sample of vaginal fluid may be taken. Your health care provider will look at this sample under a microscope to check for bacteria and abnormal cells. A vaginal pH test may also be done.  TREATMENT  Bacterial vaginosis may be treated with antibiotic medicines. These may be given in the form of a pill or a vaginal cream. A second round of antibiotics may be prescribed if the condition comes back after treatment.  HOME CARE INSTRUCTIONS   Only take over-the-counter or prescription medicines as directed by your health care provider.  If antibiotic medicine was prescribed, take it as directed. Make sure you finish it even if you start to feel better.  Do not have sex until treatment is completed.  Tell all sexual partners that you have a vaginal infection. They should see their health care provider and be treated if they have problems, such as a mild rash or itching.  Practice safe sex by using condoms and only having one sex partner. SEEK MEDICAL CARE IF:   Your symptoms are not improving after 3 days of treatment.  You have increased discharge or pain.  You have a fever. MAKE SURE YOU:   Understand these instructions.  Will watch your condition.  Will get help right away if you are not doing well or get worse. FOR MORE INFORMATION  Centers for Disease Control and Prevention, Division of STD Prevention: [employer]raiser[employer]raud[employer] American Sexual Health Association (ASHA): www.ashastd.org  Document Released: 12/19/2004 Document Revised: 10/09/2012 Document  Reviewed: 07/31/2012 [facility] Patient Information 2015 [place], [place]. This information is not intended to replace advice given to you by your health care provider. Make sure you discuss any questions you have with your health care provider.

## 2013-07-17 NOTE — ED Provider Notes (Addendum)
CSN: [id number]     Arrival date & time 07/17/13  1358 History   First MD Initiated Contact with Patient 07/17/13 1501     Chief Complaint  Patient presents with  . Palpitations     (Consider location/radiation/quality/duration/timing/severity/associated sxs/prior Treatment) HPI 38 year old female presents with chills since last night. She's also been having intermittent palpitations. She gets palpitations she becomes short of breath or stenosis rapid breathing. Denies any cough. She noticed a fever when she arrived to the hospital. He been having diffuse aches but otherwise no localized pain until she arrived here. Since arrival to the ER she has noticed a headache. This headache is similar to her chronic migraines. Denies any neck pain or stiffness. One month ago had a miscarriage. She states she saw her gynecologist as well as his happening. She did not have any procedures done. She states she thinks she saw tissue and blood clots come out at that time. Has a urinary symptoms. She has had some vaginal discharge as well.  Past Medical History  Diagnosis Date  . Migraine    Past Surgical History  Procedure Laterality Date  . Cesarean section     No family history on file. History  Substance Use Topics  . Smoking status: Never Smoker   . Smokeless tobacco: Not on file  . Alcohol Use: No   OB History   Grav Para Term Preterm Abortions TAB SAB Ect Mult Living                 Review of Systems  Constitutional: Positive for fever and chills.  Respiratory: Positive for shortness of breath. Negative for cough.   Cardiovascular: Negative for chest pain.  Gastrointestinal: Negative for vomiting and abdominal pain.  Genitourinary: Negative for dysuria and vaginal discharge.  Musculoskeletal: Negative for neck pain and neck stiffness.  Neurological: Positive for dizziness and headaches.  All other systems reviewed and are negative.     Allergies  Ciprofloxacin  Home Medications    Prior to Admission medications   Medication Sig Start Date End Date Taking? Authorizing Provider  Doxylamine-Pyridoxine (DICLEGIS) 10-10 MG TBEC Take 2 tablets by mouth at bedtime. 03/09/13   Hope Bunnie Pion, NP  ibuprofen (ADVIL,MOTRIN) 600 MG tablet Take 1 tablet (600 mg total) by mouth every 6 (six) hours as needed for pain. 05/22/12   Julianne Rice, MD  metoCLOPramide (REGLAN) 10 MG tablet Take 1 tablet (10 mg total) by mouth every 6 (six) hours as needed (nausea/headache). 05/22/12   Julianne Rice, MD  traMADol (ULTRAM) 50 MG tablet Take 1 tablet (50 mg total) by mouth every 6 (six) hours as needed for pain. 05/22/12   Julianne Rice, MD   Pulse 121  Temp(Src) 101.4 F (38.6 C) (Oral)  Resp 24  Ht 5\' 2"  (1.575 m)  Wt 232 lb (105.235 kg)  BMI 42.42 kg/m2  SpO2 100%  LMP 07/17/2013 Physical Exam  Nursing note and vitals reviewed. Constitutional: She is oriented to person, place, and time. She appears well-developed and well-nourished. No distress.  HENT:  Head: Normocephalic and atraumatic.  Right Ear: External ear normal.  Left Ear: External ear normal.  Nose: Nose normal.  Eyes: EOM are normal. Pupils are equal, round, and reactive to light. Right eye exhibits no discharge. Left eye exhibits no discharge.  Neck: Neck supple.  Cardiovascular: Regular rhythm and normal heart sounds.  Tachycardia present.   Pulmonary/Chest: Effort normal and breath sounds normal. She has no rales.  Abdominal: Soft. There is  tenderness in the suprapubic area.  Genitourinary: Uterus is not tender. Cervix exhibits no motion tenderness. Right adnexum displays no mass. Left adnexum displays no mass. Vaginal discharge (scant) found.  Neurological: She is alert and oriented to person, place, and time. No cranial nerve deficit. She exhibits normal muscle tone.  Normal strength  Skin: Skin is warm and dry.    ED Course  Procedures (including critical care time) Labs Review Labs Reviewed  WET PREP,  GENITAL - Abnormal; Notable for the following:    Clue Cells Wet Prep HPF POC MODERATE (*)    WBC, Wet Prep HPF POC FEW (*)    All other components within normal limits  URINALYSIS, ROUTINE W REFLEX MICROSCOPIC - Abnormal; Notable for the following:    Protein, ur 30 (*)    All other components within normal limits  CBC WITH DIFFERENTIAL - Abnormal; Notable for the following:    WBC 14.8 (*)    MCV 77.5 (*)    Platelets 465 (*)    Neutrophils Relative % 80 (*)    Neutro Abs 11.9 (*)    All other components within normal limits  COMPREHENSIVE METABOLIC PANEL - Abnormal; Notable for the following:    Glucose, Bld 111 (*)    GFR calc non Af Amer 71 (*)    GFR calc Af Amer 82 (*)    All other components within normal limits  GC/CHLAMYDIA PROBE AMP  PREGNANCY, URINE  URINE MICROSCOPIC-ADD ON  I-STAT CG4 LACTIC ACID, ED    Imaging Review Dg Chest 2 View  07/17/2013   CLINICAL DATA:  Fever and weakness.  EXAM: CHEST  2 VIEW  COMPARISON:  None.  FINDINGS: Mediastinum and hilar structures are normal. Lungs are clear. Heart size normal. No pleural effusion or pneumothorax.  IMPRESSION: No acute abnormality.   Electronically Signed   By: Marcello Moores  Register   On: 07/17/2013 15:33   US Transvaginal Non-ob  07/17/2013   CLINICAL DATA:  Fever, miscarriage 1 month ago, rule out retained products of conception. Two previous C-sections.  EXAM: TRANSABDOMINAL AND TRANSVAGINAL ULTRASOUND OF PELVIS  TECHNIQUE: Both transabdominal and transvaginal ultrasound examinations of the pelvis were performed. Transabdominal technique was performed for global imaging of the pelvis including uterus, ovaries, adnexal regions, and pelvic cul-de-sac. It was necessary to proceed with endovaginal exam following the transabdominal exam to visualize the endometrium and ovaries.  COMPARISON:  03/09/2013  FINDINGS: Uterus  Measurements: 4.8 x 7.0 x 10.8 cm. No fibroids or other mass visualized.  Endometrium  Thickness: 17.8 mm.  Somewhat heterogeneous but no focal abnormality visualized. No increased vascularity noted.  Right ovary  Measurements: 2.7 x 2.9 x 4.7 cm. Normal appearance/no adnexal mass.  Left ovary  Measurements: 2.4 x 3.0 x 3.8 cm. Normal appearance/no adnexal mass.  Other findings  No free fluid.  IMPRESSION: Normal size uterus. Mild endometrial thickening measuring 17.8 mm with slight heterogeneity, but no focal mass or evidence of increased vascularity to suggest retained products of conception.   Electronically Signed   By: Marin Olp M.D.   On: 07/17/2013 18:36   US Pelvis Complete  07/17/2013   CLINICAL DATA:  Fever, miscarriage 1 month ago, rule out retained products of conception. Two previous C-sections.  EXAM: TRANSABDOMINAL AND TRANSVAGINAL ULTRASOUND OF PELVIS  TECHNIQUE: Both transabdominal and transvaginal ultrasound examinations of the pelvis were performed. Transabdominal technique was performed for global imaging of the pelvis including uterus, ovaries, adnexal regions, and pelvic cul-de-sac. It was necessary to  proceed with endovaginal exam following the transabdominal exam to visualize the endometrium and ovaries.  COMPARISON:  03/09/2013  FINDINGS: Uterus  Measurements: 4.8 x 7.0 x 10.8 cm. No fibroids or other mass visualized.  Endometrium  Thickness: 17.8 mm. Somewhat heterogeneous but no focal abnormality visualized. No increased vascularity noted.  Right ovary  Measurements: 2.7 x 2.9 x 4.7 cm. Normal appearance/no adnexal mass.  Left ovary  Measurements: 2.4 x 3.0 x 3.8 cm. Normal appearance/no adnexal mass.  Other findings  No free fluid.  IMPRESSION: Normal size uterus. Mild endometrial thickening measuring 17.8 mm with slight heterogeneity, but no focal mass or evidence of increased vascularity to suggest retained products of conception.   Electronically Signed   By: Marin Olp M.D.   On: 07/17/2013 18:36     EKG Interpretation   Date/Time:  Thursday July 17 2013 14:14:31  EDT Ventricular Rate:  124 PR Interval:  142 QRS Duration: 74 QT Interval:  290 QTC Calculation: 416 R Axis:   96 Text Interpretation:  Sinus tachycardia Rightward axis Borderline ECG  Otherwise normal ECG No old tracing to compare Confirmed by Kashton Mcartor  MD,  Yi Falletta ([id number]) on 07/17/2013 3:18:27 PM      MDM   Final diagnoses:  Fever, unspecified fever cause  BV (bacterial vaginosis)  Palpitations  Other type of nonintractable migraine    Patient has no obvious source for her fever and chills. She developed a headache while in the emergency department but is typical of her migraines that she gets quite often. I highly doubt a CNS cause of her feet. Her headache is not different in any way. Patient pain improved while in the ED. She has minimal lower abdominal tenderness, but do to her recent miscarriage workup was done to rule out a septic uterus. Her workup is benign. Discussed her ultrasound with Dr. Elly Modena, and feels that this is an un-concerning ultrasound. She otherwise has no other signs of an obvious bacterial cause of fever. She is significantly improved in the ED.    Ephraim Hamburger, MD 07/17/13 1937  Ephraim Hamburger, MD 07/17/13 [id number]

## 2013-07-17 NOTE — ED Notes (Signed)
Pt. Reports she drove herself to ED

## 2013-07-17 NOTE — ED Notes (Signed)
Pt. Reports she had a miscarriage month.  No d&c done on Pt.   She reports she has had some cramping in lower abd.  No bleeding at this time.   Pt. Reports no sexual activity since the miscarriage.

## 2013-07-17 NOTE — ED Notes (Signed)
Explained to Pt. That she can not eat until Dr. Aurora Mask her results from [place].

## 2013-07-17 NOTE — ED Notes (Signed)
Pt. C/o being cold and hungry.  Pt. Has noted 4 blankets on her.  Pt. Not given food due to waiting on results of [place] and EDP to see Pt.

## 2013-07-17 NOTE — ED Notes (Signed)
Palpitations at 12pm. Chills and fever since last night.

## 2013-07-18 ENCOUNTER — Emergency Department (HOSPITAL_BASED_OUTPATIENT_CLINIC_OR_DEPARTMENT_OTHER)
Admission: EM | Admit: 2013-07-18 | Discharge: 2013-07-18 | Disposition: A | Discharge status: Home / Self Care | Payer: BC Managed Care – PPO | Attending: Emergency Medicine | Admitting: Emergency Medicine

## 2013-07-18 ENCOUNTER — Encounter (HOSPITAL_BASED_OUTPATIENT_CLINIC_OR_DEPARTMENT_OTHER): Payer: Self-pay | Admitting: Emergency Medicine

## 2013-07-18 DIAGNOSIS — R509 Fever, unspecified: Secondary | ICD-10-CM | POA: Diagnosis present

## 2013-07-18 DIAGNOSIS — Z792 Long term (current) use of antibiotics: Secondary | ICD-10-CM | POA: Diagnosis not present

## 2013-07-18 DIAGNOSIS — Z8679 Personal history of other diseases of the circulatory system: Secondary | ICD-10-CM | POA: Insufficient documentation

## 2013-07-18 DIAGNOSIS — J039 Acute tonsillitis, unspecified: Secondary | ICD-10-CM | POA: Diagnosis not present

## 2013-07-18 MED ORDER — CLINDAMYCIN PHOSPHATE 900 MG/6ML IJ SOLN
600.0000 mg | Freq: Once | INTRAMUSCULAR | Status: AC
  Administered 2013-07-18: 600 mg via INTRAMUSCULAR
  Filled 2013-07-18: qty 12

## 2013-07-18 MED ORDER — CLINDAMYCIN PHOSPHATE 900 MG/6ML IJ SOLN
INTRAMUSCULAR | Status: AC
  Filled 2013-07-18: qty 6

## 2013-07-18 MED ORDER — CLINDAMYCIN HCL 150 MG PO CAPS: 300.0000 mg | ORAL_CAPSULE | Freq: Four times a day (QID) | ORAL | Status: DC

## 2013-07-18 MED ORDER — METHYLPREDNISOLONE SODIUM SUCC 125 MG IJ SOLR
INTRAMUSCULAR | Status: AC
  Administered 2013-07-18: 125 mg via INTRAMUSCULAR
  Filled 2013-07-18: qty 2

## 2013-07-18 MED ORDER — HYDROCODONE-ACETAMINOPHEN 5-325 MG PO TABS: 2.0000 | ORAL_TABLET | ORAL | Status: DC | PRN

## 2013-07-18 MED ORDER — DEXAMETHASONE SODIUM PHOSPHATE 4 MG/ML IJ SOLN
INTRAMUSCULAR | Status: AC
  Filled 2013-07-18: qty 3

## 2013-07-18 MED ORDER — METHYLPREDNISOLONE SODIUM SUCC 125 MG IJ SOLR
125.0000 mg | Freq: Once | INTRAMUSCULAR | Status: AC
  Administered 2013-07-18: 125 mg via INTRAMUSCULAR

## 2013-07-18 MED ORDER — DEXAMETHASONE SODIUM PHOSPHATE 10 MG/ML IJ SOLN: 10.0000 mg | Freq: Once | INTRAMUSCULAR | Status: DC

## 2013-07-18 NOTE — Discharge Instructions (Signed)
Tonsillitis Tonsillitis is an infection of the throat that causes the tonsils to become red, tender, and swollen. Tonsils are collections of lymphoid tissue at the back of the throat. Each tonsil has crevices (crypts). Tonsils help fight nose and throat infections and keep infection from spreading to other parts of the body for the first 18 months of life.  CAUSES Sudden (acute) tonsillitis is usually caused by infection with streptococcal bacteria. Long-lasting (chronic) tonsillitis occurs when the crypts of the tonsils become filled with pieces of food and bacteria, which makes it easy for the tonsils to become repeatedly infected. SYMPTOMS  Symptoms of tonsillitis include:  A sore throat, with possible difficulty swallowing.  White patches on the tonsils.  Fever.  Tiredness.  New episodes of snoring during sleep, when you did not snore before.  Small, foul-smelling, yellowish-white pieces of material (tonsilloliths) that you occasionally cough up or spit out. The tonsilloliths can also cause you to have bad breath. DIAGNOSIS Tonsillitis can be diagnosed through a physical exam. Diagnosis can be confirmed with the results of lab tests, including a throat culture. TREATMENT  The goals of tonsillitis treatment include the reduction of the severity and duration of symptoms and prevention of associated conditions. Symptoms of tonsillitis can be improved with the use of steroids to reduce the swelling. Tonsillitis caused by bacteria can be treated with antibiotic medicines. Usually, treatment with antibiotic medicines is started before the cause of the tonsillitis is known. However, if it is determined that the cause is not bacterial, antibiotic medicines will not treat the tonsillitis. If attacks of tonsillitis are severe and frequent, your health care provider may recommend surgery to remove the tonsils (tonsillectomy). HOME CARE INSTRUCTIONS   Rest as much as possible and get plenty of  sleep.  Drink plenty of fluids. While the throat is very sore, eat soft foods or liquids, such as sherbet, soups, or instant breakfast drinks.  Eat frozen ice pops.  Gargle with a warm or cold liquid to help soothe the throat. Mix 1/4 teaspoon of salt and 1/4 teaspoon of baking soda in in 8 oz of water. SEEK MEDICAL CARE IF:   Large, tender lumps develop in your neck.  A rash develops.  A green, yellow-brown, or bloody substance is coughed up.  You are unable to swallow liquids or food for 24 hours.  You notice that only one of the tonsils is swollen. SEEK IMMEDIATE MEDICAL CARE IF:   You develop any new symptoms such as vomiting, severe headache, stiff neck, chest pain, or trouble breathing or swallowing.  You have severe throat pain along with drooling or voice changes.  You have severe pain, unrelieved with recommended medications.  You are unable to fully open the mouth.  You develop redness, swelling, or severe pain anywhere in the neck.  You have a fever. MAKE SURE YOU:   Understand these instructions.  Will watch your condition.  Will get help right away if you are not doing well or get worse. Document Released: 09/28/2004 Document Revised: 12/24/2012 Document Reviewed: 06/07/2012 [facility] Patient Information 2015 [place], [place]. This information is not intended to replace advice given to you by your health care provider. Make sure you discuss any questions you have with your health care provider.

## 2013-07-18 NOTE — ED Notes (Signed)
No warm blankets given do to the patient's fever is 103.1. She was told by this EMT that her fever was to high for warm blanket, Charge nurse agreed.

## 2013-07-18 NOTE — ED Provider Notes (Signed)
CSN: [id number]     Arrival date & time 07/18/13  1912 History  This chart was scribed for Orpah Greek, * by Irene Pap, ED Scribe. This patient was seen in room MH11/MH11 and patient care was started at 7:27 PM.    Chief Complaint  Patient presents with  . Fever   The history is provided by the patient. No language interpreter was used.   HPI Comments: Laura Kramer is a 38 y.o. female who presents to the Emergency Department complaining of intermittent chills and fever onset one day ago. She states that she was seen yesterday but the symptoms have persisted after her visit. She reports an associated sore throat and trouble swallowing. She states that it hurts for her to swallow and it feels like she has a knot in her throat.  She states that she has not had a tonsillectomy before. She reports an allergy to ciprofloxacin. She reports that she was dehydrated yesterday. She states that she started some antibiotics today from her visit yesterday. She denies cough or sinus pain.  Past Medical History  Diagnosis Date  . Migraine    Past Surgical History  Procedure Laterality Date  . Cesarean section     No family history on file. History  Substance Use Topics  . Smoking status: Never Smoker   . Smokeless tobacco: Not on file  . Alcohol Use: No   OB History   Grav Para Term Preterm Abortions TAB SAB Ect Mult Living                 Review of Systems  Constitutional: Positive for fever and chills.  HENT: Positive for sore throat and trouble swallowing. Negative for sinus pressure.   Respiratory: Negative for cough.   All other systems reviewed and are negative.  Allergies  Ciprofloxacin  Home Medications   Prior to Admission medications   Medication Sig Start Date End Date Taking? Authorizing Provider  clindamycin (CLEOCIN) 150 MG capsule Take 2 capsules (300 mg total) by mouth 4 (four) times daily. 07/18/13   Orpah Greek, MD  Doxylamine-Pyridoxine  (DICLEGIS) 10-10 MG TBEC Take 2 tablets by mouth at bedtime. 03/09/13   Hope Bunnie Pion, NP  HYDROcodone-acetaminophen (NORCO/VICODIN) 5-325 MG per tablet Take 2 tablets by mouth every 4 (four) hours as needed for moderate pain. 07/18/13   Orpah Greek, MD  ibuprofen (ADVIL,MOTRIN) 600 MG tablet Take 1 tablet (600 mg total) by mouth every 6 (six) hours as needed for pain. 05/22/12   Julianne Rice, MD  metoCLOPramide (REGLAN) 10 MG tablet Take 1 tablet (10 mg total) by mouth every 6 (six) hours as needed (nausea/headache). 05/22/12   Julianne Rice, MD  metroNIDAZOLE (FLAGYL) 500 MG tablet Take 1 tablet (500 mg total) by mouth 2 (two) times daily. One po bid x 7 days 07/17/13   Ephraim Hamburger, MD  traMADol (ULTRAM) 50 MG tablet Take 1 tablet (50 mg total) by mouth every 6 (six) hours as needed for pain. 05/22/12   Julianne Rice, MD   BP 135/78  Pulse 107  Temp(Src) 103.1 F (39.5 C) (Oral)  Resp 20  Ht 5\' 2"  (1.575 m)  Wt 232 lb (105.235 kg)  BMI 42.42 kg/m2  SpO2 99%  LMP 07/17/2013 Physical Exam  Constitutional: She is oriented to person, place, and time. She appears well-developed and well-nourished. No distress.  HENT:  Head: Normocephalic and atraumatic.  Right Ear: Hearing normal.  Left Ear: Hearing normal.  Nose: Nose  normal.  Mouth/Throat: Oropharynx is clear and moist and mucous membranes are normal.  Erythema, bilateral tonsillar exudate, anterior cervical adenopathy  Swelling of bilat tonsillar pillar, left greater than right. No uvular displacement  Eyes: Conjunctivae and EOM are normal. Pupils are equal, round, and reactive to light.  Neck: Normal range of motion. Neck supple.  Cardiovascular: Regular rhythm, S1 normal and S2 normal.  Exam reveals no gallop and no friction rub.   No murmur heard. Pulmonary/Chest: Effort normal and breath sounds normal. No respiratory distress. She exhibits no tenderness.  Abdominal: Soft. Normal appearance and bowel sounds are  normal. There is no hepatosplenomegaly. There is no tenderness. There is no rebound, no guarding, no tenderness at McBurney's point and negative Murphy's sign. No hernia.  Musculoskeletal: Normal range of motion.  Neurological: She is alert and oriented to person, place, and time. She has normal strength. No cranial nerve deficit or sensory deficit. Coordination normal. GCS eye subscore is 4. GCS verbal subscore is 5. GCS motor subscore is 6.  Skin: Skin is warm, dry and intact. No rash noted. No cyanosis.  Psychiatric: She has a normal mood and affect. Her speech is normal and behavior is normal. Thought content normal.    ED Course  Procedures (including critical care time) DIAGNOSTIC STUDIES: Oxygen Saturation is 99% on room air, normal by my interpretation.    COORDINATION OF CARE: 7:32 PM-Discussed treatment plan which includes antibiotics, steroids, and f/u with [facility] if symptoms persist with pt at bedside and pt agreed to plan.   Labs Review Labs Reviewed - No data to display  Imaging Review Dg Chest 2 View  07/17/2013   CLINICAL DATA:  Fever and weakness.  EXAM: CHEST  2 VIEW  COMPARISON:  None.  FINDINGS: Mediastinum and hilar structures are normal. Lungs are clear. Heart size normal. No pleural effusion or pneumothorax.  IMPRESSION: No acute abnormality.   Electronically Signed   By: Marcello Moores  Register   On: 07/17/2013 15:33   US Transvaginal Non-ob  07/17/2013   CLINICAL DATA:  Fever, miscarriage 1 month ago, rule out retained products of conception. Two previous C-sections.  EXAM: TRANSABDOMINAL AND TRANSVAGINAL ULTRASOUND OF PELVIS  TECHNIQUE: Both transabdominal and transvaginal ultrasound examinations of the pelvis were performed. Transabdominal technique was performed for global imaging of the pelvis including uterus, ovaries, adnexal regions, and pelvic cul-de-sac. It was necessary to proceed with endovaginal exam following the transabdominal exam to visualize the  endometrium and ovaries.  COMPARISON:  03/09/2013  FINDINGS: Uterus  Measurements: 4.8 x 7.0 x 10.8 cm. No fibroids or other mass visualized.  Endometrium  Thickness: 17.8 mm. Somewhat heterogeneous but no focal abnormality visualized. No increased vascularity noted.  Right ovary  Measurements: 2.7 x 2.9 x 4.7 cm. Normal appearance/no adnexal mass.  Left ovary  Measurements: 2.4 x 3.0 x 3.8 cm. Normal appearance/no adnexal mass.  Other findings  No free fluid.  IMPRESSION: Normal size uterus. Mild endometrial thickening measuring 17.8 mm with slight heterogeneity, but no focal mass or evidence of increased vascularity to suggest retained products of conception.   Electronically Signed   By: Marin Olp M.D.   On: 07/17/2013 18:36   US Pelvis Complete  07/17/2013   CLINICAL DATA:  Fever, miscarriage 1 month ago, rule out retained products of conception. Two previous C-sections.  EXAM: TRANSABDOMINAL AND TRANSVAGINAL ULTRASOUND OF PELVIS  TECHNIQUE: Both transabdominal and transvaginal ultrasound examinations of the pelvis were performed. Transabdominal technique was performed for global imaging of  the pelvis including uterus, ovaries, adnexal regions, and pelvic cul-de-sac. It was necessary to proceed with endovaginal exam following the transabdominal exam to visualize the endometrium and ovaries.  COMPARISON:  03/09/2013  FINDINGS: Uterus  Measurements: 4.8 x 7.0 x 10.8 cm. No fibroids or other mass visualized.  Endometrium  Thickness: 17.8 mm. Somewhat heterogeneous but no focal abnormality visualized. No increased vascularity noted.  Right ovary  Measurements: 2.7 x 2.9 x 4.7 cm. Normal appearance/no adnexal mass.  Left ovary  Measurements: 2.4 x 3.0 x 3.8 cm. Normal appearance/no adnexal mass.  Other findings  No free fluid.  IMPRESSION: Normal size uterus. Mild endometrial thickening measuring 17.8 mm with slight heterogeneity, but no focal mass or evidence of increased vascularity to suggest retained  products of conception.   Electronically Signed   By: Marin Olp M.D.   On: 07/17/2013 18:36     EKG Interpretation None      MDM   Final diagnoses:  Tonsillitis   Patient presents to the ER for fever once again. Patient was seen yesterday for fever, had a thorough workup. Etiology of the fever was unclear at that time. Since then, however, patient has developed a sore throat. Patient reports pain and swelling in her throat that worsens with swallowing. Having difficulty swallowing because of the pain. Examination did reveal erythema, swelling with exudate. The left side of her throat is more swollen than the right, but no obvious signs of abscess at this time. Patient will be treated with clindamycin and Decadron. Because of the patient's increased pain on the left side with slightly increased swelling of the left side, I recommended followup with ENT. Patient was also counseled to go to the emergency department if she has increased swelling of the throat.   I personally performed the services described in this documentation, which was scribed in my presence. The recorded information has been reviewed and is accurate.      Orpah Greek, MD 07/18/13 [phone]

## 2013-07-18 NOTE — ED Notes (Signed)
Fever and sore throat. She was here yesterday with fever and chills. States she took Tylenol on the way here.

## 2013-07-19 LAB — GC/CHLAMYDIA PROBE AMP
CT Probe RNA: NEGATIVE
GC PROBE AMP APTIMA: NEGATIVE

## 2014-04-08 IMAGING — US US OB TRANSVAGINAL
1 series · 14 of 28 positions shown · non-contrast
Comparison: None.

CLINICAL DATA: Abdominal cramping.  Beta HCG level 14,621

EXAM:
OBSTETRIC <14 WK US AND TRANSVAGINAL OB US
TECHNIQUE: Both transabdominal and transvaginal ultrasound examinations were
performed for complete evaluation of the gestation as well as the
maternal uterus, adnexal regions, and pelvic cul-de-sac.
Transvaginal technique was performed to assess early pregnancy.

[Series 1: us ob transvaginal · 0.23mm/px · 14 of 59 slices shown]
[im 3/59]
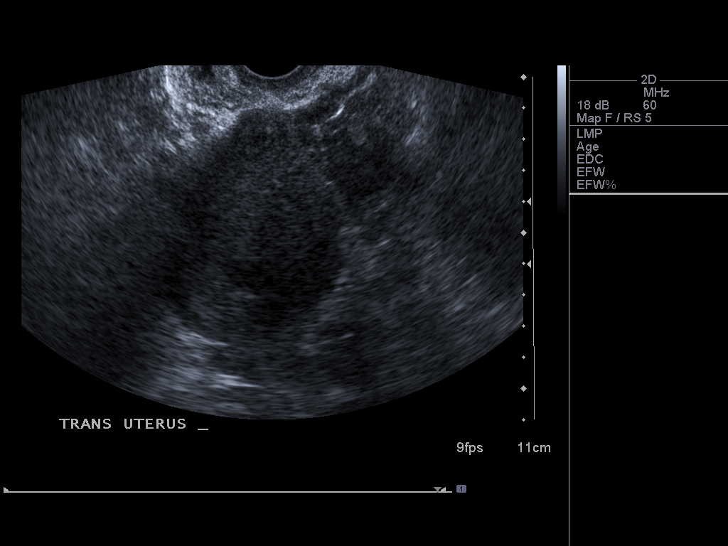
[im 7/59]
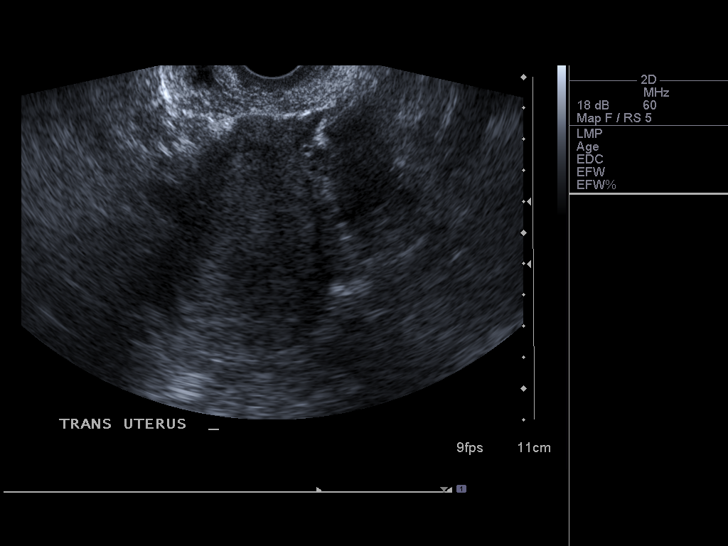
[im 11/59]
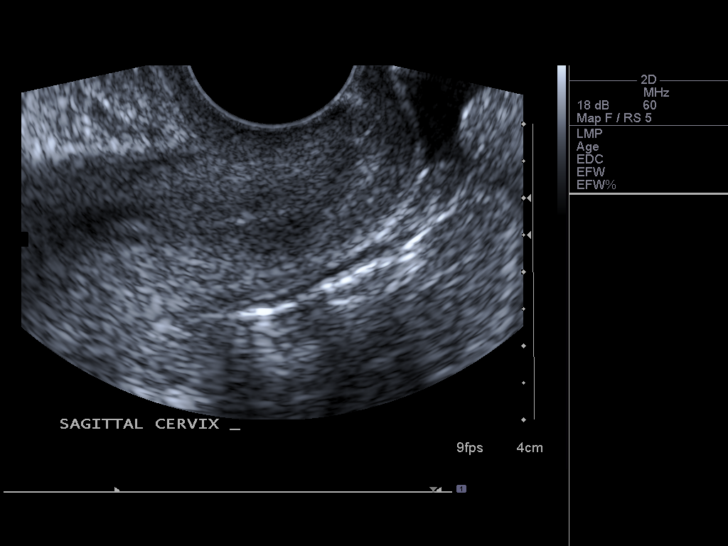
[im 16/59]
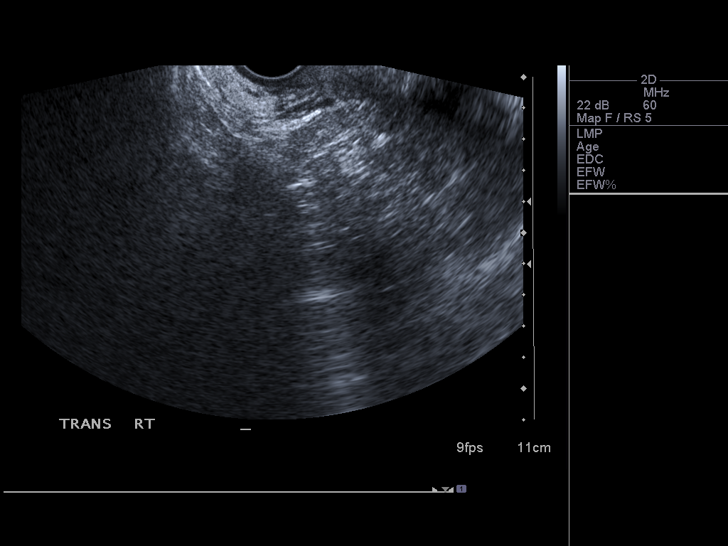
[im 20/59]
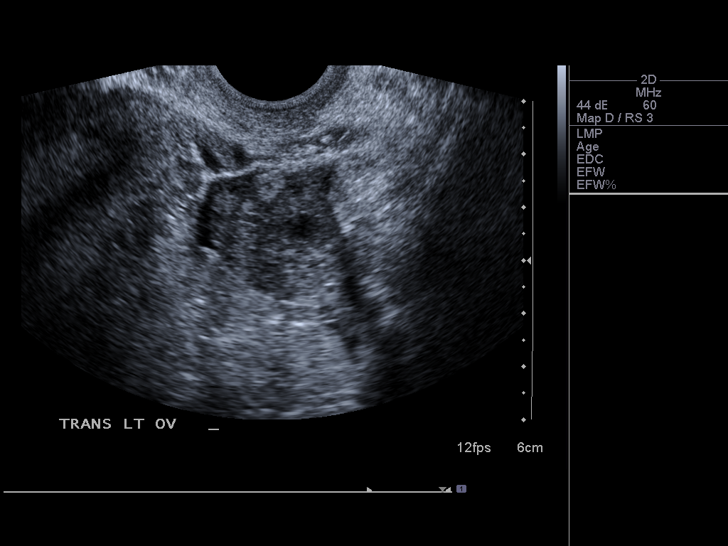
[im 24/59]
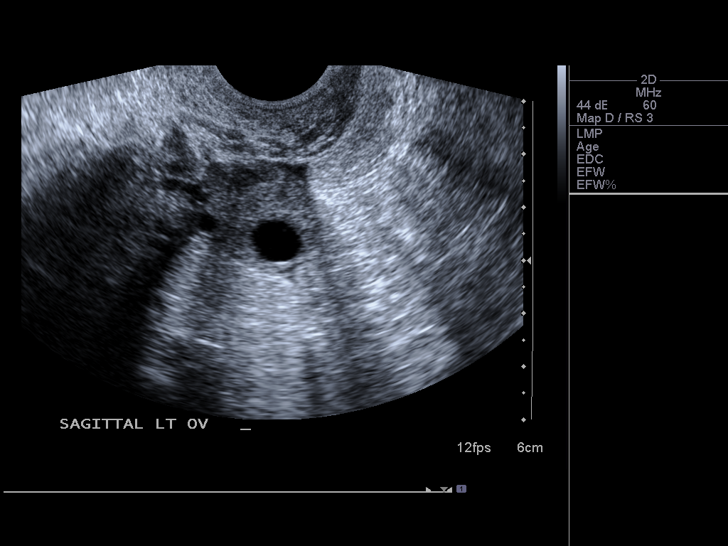
[im 28/59]
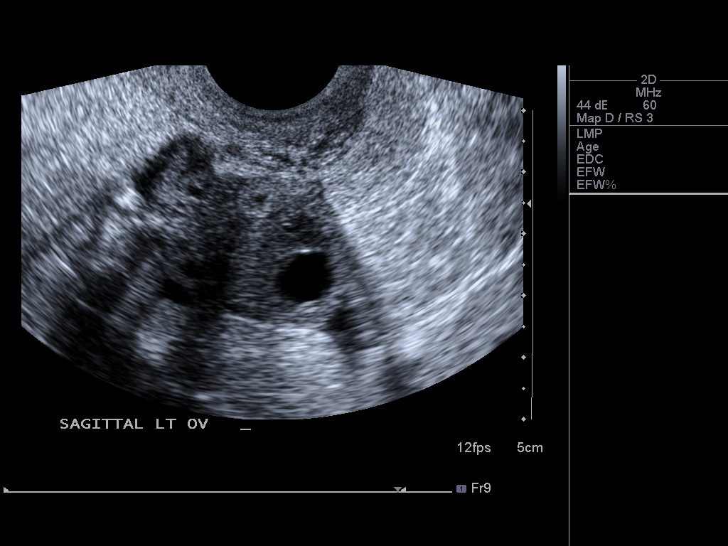
[im 33/59]
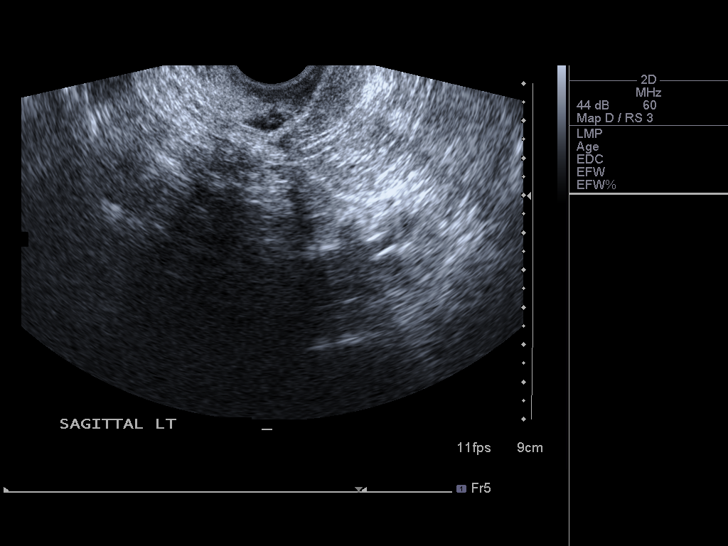
[im 37/59]
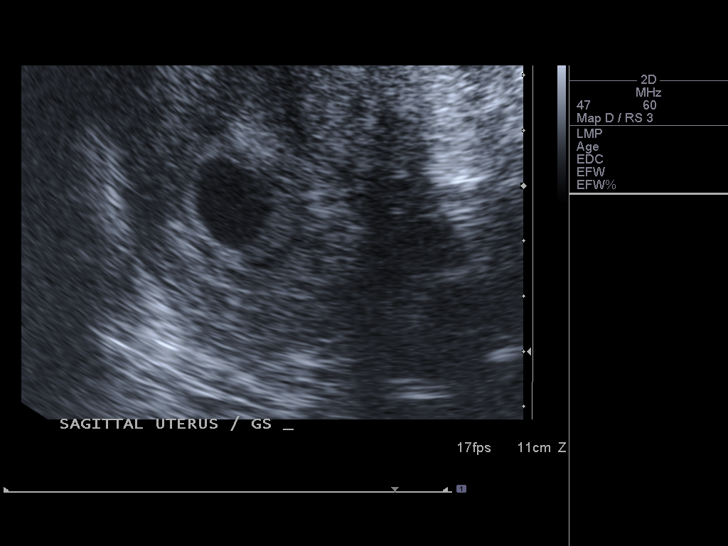
[im 41/59]
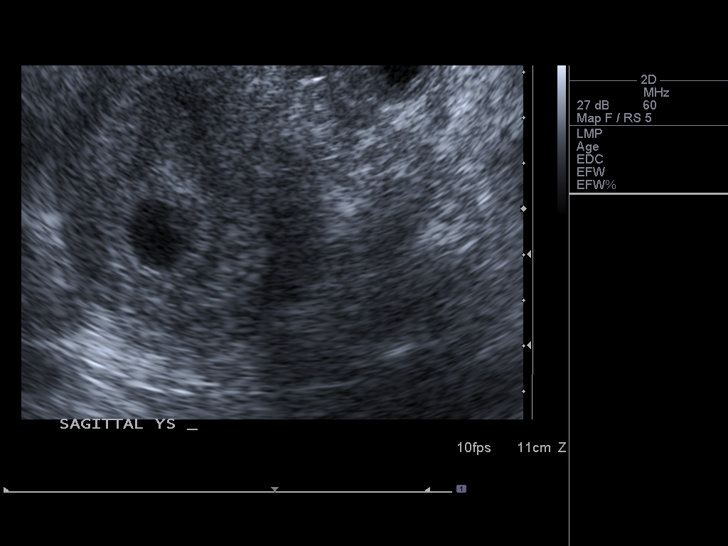
[im 46/59]
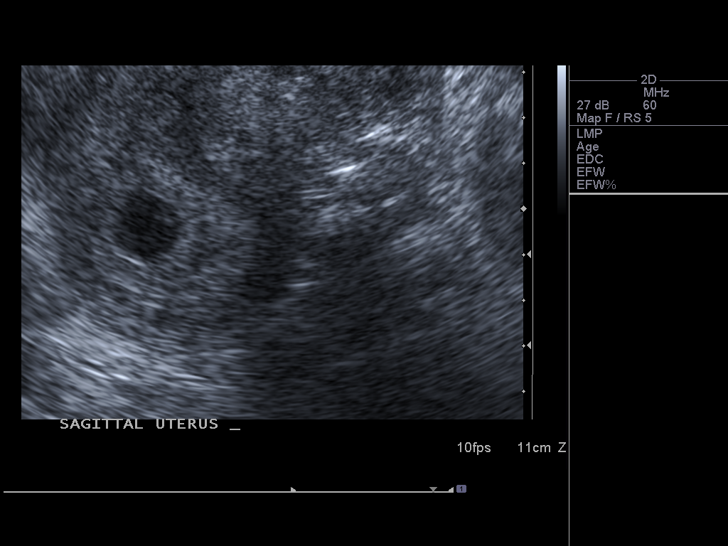
[im 50/59]
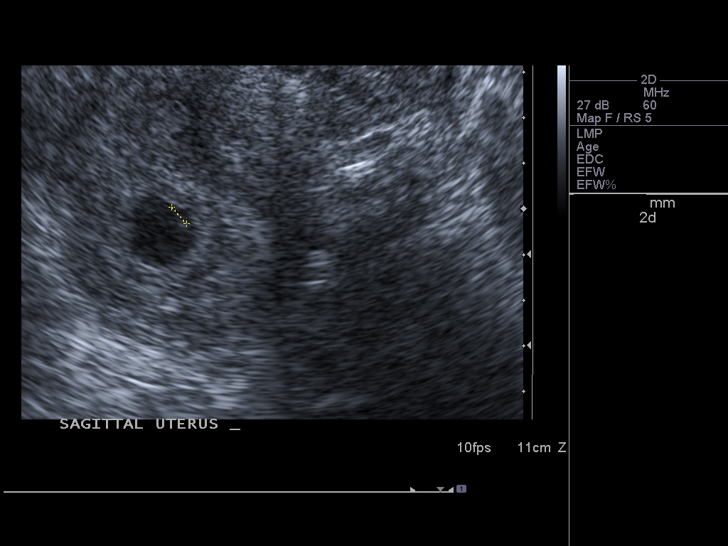
[im 54/59]
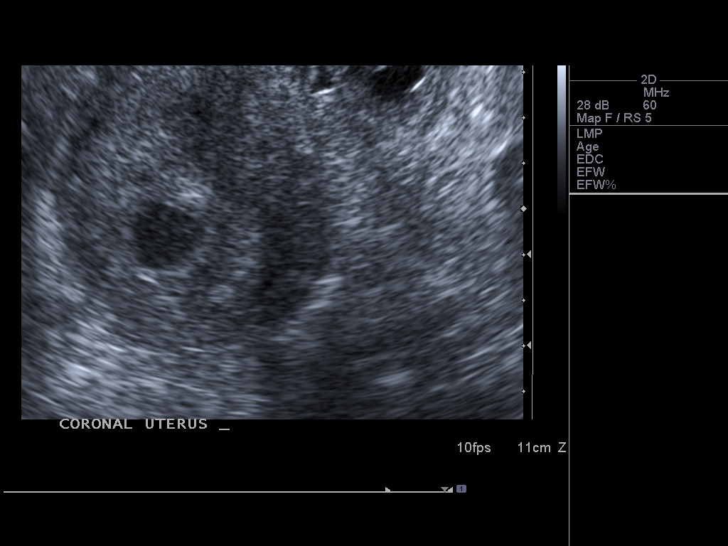
[im 59/59]
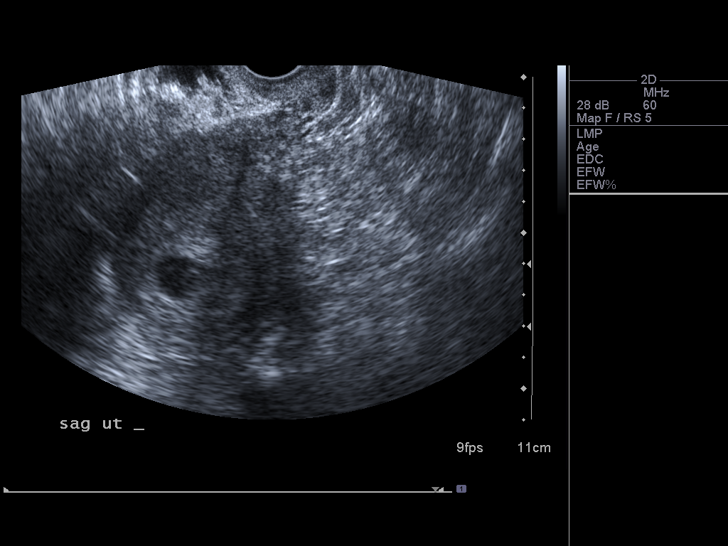

[14 of 28 positions shown; findings below may reference images not displayed]

FINDINGS: Intrauterine gestational sac: Visualized/normal in shape.

Yolk sac:  Yes

Embryo:  Yes

Cardiac Activity: Subtle cardiac flicker evident.

Heart Rate:  127 bpm

CRL:   5  mm   6 w 2 d                  US EDC: 10/23/2013

Maternal uterus/adnexae: No uterine mass. No subchorionic
hemorrhage. Normal cervix.

Left ovary normal in size. Small echogenic focus measuring 10 mm
within the left ovary, nonspecific. Right ovary not visualized. No
adnexal masses. Heterogeneous area of echogenicity posterior to the
uterus, which may be bowel. Trace pelvic free fluid.
IMPRESSION: 1. Single live intrauterine pregnancy with a measured gestational
age of 6 weeks and 2 days. No emergent maternal or fetal
complication.

## 2014-06-23 ENCOUNTER — Encounter: Payer: Self-pay | Admitting: Internal Medicine

## 2014-07-03 ENCOUNTER — Encounter: Payer: Self-pay | Admitting: Internal Medicine

## 2014-07-03 ENCOUNTER — Other Ambulatory Visit (INDEPENDENT_AMBULATORY_CARE_PROVIDER_SITE_OTHER): Payer: Medicaid Other

## 2014-07-03 ENCOUNTER — Ambulatory Visit (INDEPENDENT_AMBULATORY_CARE_PROVIDER_SITE_OTHER): Payer: Medicaid Other | Admitting: Internal Medicine

## 2014-07-03 VITALS — BP 132/82 | HR 88 | Ht 63.75 in | Wt 239.0 lb

## 2014-07-03 DIAGNOSIS — K8020 Calculus of gallbladder without cholecystitis without obstruction: Secondary | ICD-10-CM | POA: Diagnosis not present

## 2014-07-03 DIAGNOSIS — R197 Diarrhea, unspecified: Secondary | ICD-10-CM

## 2014-07-03 DIAGNOSIS — R110 Nausea: Secondary | ICD-10-CM

## 2014-07-03 LAB — COMPREHENSIVE METABOLIC PANEL
ALT: 32 U/L (ref 0–35)
AST: 18 U/L (ref 0–37)
Albumin: 4.2 g/dL (ref 3.5–5.2)
Alkaline Phosphatase: 71 U/L (ref 39–117)
BILIRUBIN TOTAL: 0.3 mg/dL (ref 0.2–1.2)
BUN: 14 mg/dL (ref 6–23)
CALCIUM: 9.6 mg/dL (ref 8.4–10.5)
CO2: 27 mEq/L (ref 19–32)
Chloride: 103 mEq/L (ref 96–112)
Creatinine, Ser: 0.92 mg/dL (ref 0.40–1.20)
GFR: 87.4 mL/min (ref >60.00)
Glucose, Bld: 97 mg/dL (ref 70–99)
Potassium: 3.9 mEq/L (ref 3.5–5.1)
SODIUM: 138 meq/L (ref 135–145)
TOTAL PROTEIN: 7.6 g/dL (ref 6.0–8.3)

## 2014-07-03 LAB — CBC WITH DIFFERENTIAL/PLATELET
BASOS ABS: 0.1 10*3/uL (ref 0.0–0.1)
Basophils Relative: 0.9 % (ref 0.0–3.0)
EOS ABS: 0.4 10*3/uL (ref 0.0–0.7)
Eosinophils Relative: 3.4 % (ref 0.0–5.0)
HCT: 37.9 % (ref 36.0–46.0)
HEMOGLOBIN: 12.1 g/dL (ref 12.0–15.0)
Lymphocytes Relative: 29 % (ref 12.0–46.0)
Lymphs Abs: 3.4 10*3/uL (ref 0.7–4.0)
MCHC: 32 g/dL (ref 30.0–36.0)
MCV: 78.8 fl (ref 78.0–100.0)
Monocytes Absolute: 0.8 10*3/uL (ref 0.1–1.0)
Monocytes Relative: 6.6 % (ref 3.0–12.0)
NEUTROS PCT: 60.1 % (ref 43.0–77.0)
Neutro Abs: 7 10*3/uL (ref 1.4–7.7)
Platelets: 406 10*3/uL — ABNORMAL HIGH (ref 150.0–400.0)
RBC: 4.81 Mil/uL (ref 3.87–5.11)
RDW: 16.2 % — AB (ref 11.5–15.5)
WBC: 11.7 10*3/uL — ABNORMAL HIGH (ref 4.0–10.5)

## 2014-07-03 MED ORDER — NA SULFATE-K SULFATE-MG SULF 17.5-3.13-1.6 GM/177ML PO SOLN: 1.0000 | Freq: Once | ORAL | Status: DC

## 2014-07-03 MED ORDER — OMEPRAZOLE 20 MG PO CPDR: 20.0000 mg | DELAYED_RELEASE_CAPSULE | Freq: Every day | ORAL | Status: DC

## 2014-07-03 MED ORDER — DICYCLOMINE HCL 10 MG PO CAPS: 10.0000 mg | ORAL_CAPSULE | Freq: Two times a day (BID) | ORAL | Status: DC

## 2014-07-03 NOTE — Progress Notes (Signed)
Laura Kramer Jul 11, 1975 [id number]  Note: This dictation was prepared with Dragon digital system. Any transcriptional errors that result from this procedure are unintentional.   History of Present Illness: Referred by Dr.Aguiar  39 year old African-American female with digestive problems dating back  several years. She describes acute episodes of nausea, vomiting and abdominal pain which occur  postprandially as well as  at night. She was diagnosed with C. difficile colitis 1 year ago after taking a course of clindamycin. CT scan of the abdomen at that time revealed colitis. C.Diff toxin was positive and she was hospitalized for 5 days and treated with vancomycin and subsequently with oral vancomycin x 10 days till complete resolution. But her abdominal pain has persisted and now she has almost constant dyspepsia  and belching. A positive family history of history of gallbladder disease in her mother. Upper abdominal ultrasound last week confirmed multiple small gallstones and otherwise normal-appearing gallbladder. Common bile duct was 3 mm Family history and social history have been reviewed.     Past Medical History  Diagnosis Date  . Migraine     Past Surgical History  Procedure Laterality Date  . Cesarean section      Allergies  Allergen Reactions  . Ciprofloxacin Rash    Review of Systems: Ulcerative for heartburn. Dyspepsia. Would intolerance. Bloating crampy abdominal pain and diarrhea. As before small amount of blood per rectum  The remainder of the 10 point ROS is negative except as outlined in the H&P  Physical Exam: General Appearance Well developed, in no distress, overweight  Eyes  Non icteric  HEENT  Non traumatic, normocephalic  Mouth No lesion, tongue papillated, no cheilosis Neck Supple without adenopathy, thyroid not enlarged, no carotid bruits, no JVD Lungs Clear to auscultation bilaterally COR Normal S1, normal S2, regular rhythm, no murmur, quiet  precordium Abdomen Protuberant soft without specific tenderness. No ascites. Liver edge at costal margin. Normoactive bowel sounds.  Rectal Small amount of salt Hemoccult-negative stool  Extremities  No pedal edema Skin No lesions Neurological Alert and oriented x 3 Psychological Normal mood and affect  Assessment and Plan:   39 year old African-American female with a chronic digestive symptoms involving upper GI tract, gastroesophageal reflux as well as the diarrhea and alternating bowel habits having small amount of  hematochezia. She was just diagnosed with cholelithiasis but her gallbladder does not look inflamed.on ultrasound. Her history is suggestive of biliary colic especially the attacks  at night. . We will proceed with HIDA scan with CCK and obtain liver function tests as well as lipase. We will also schedule  upper endoscopy and colonoscopy to rule out other causes such as Barrett's esophagus, gastritis, H. pylori or symptomatic diverticulosis. She agrees with the plan.We have discussed   strict low-fat diet, start omeprazole 20 mg daily and Bentyl 10 mg twice a day.    Delfin Edis 07/03/2014

## 2014-07-03 NOTE — Patient Instructions (Addendum)
You have been scheduled for a HIDA scan at [facility] (1st floor) on 07/15/2014   Please arrive 15 minutes prior to your scheduled appointment at 1:44RX  . Make certain not to have anything to eat or drink at least 6 hours prior to your test. Should this appointment date or time not work well for you, please call radiology scheduling at [phone].  _____________________________________________________________________ hepatobiliary (HIDA) scan is an imaging procedure used to diagnose problems in the liver, gallbladder and bile ducts. In the HIDA scan, a radioactive chemical or tracer is injected into a vein in your arm. The tracer is handled by the liver like bile. Bile is a fluid produced and excreted by your liver that helps your digestive system break down fats in the foods you eat. Bile is stored in your gallbladder and the gallbladder releases the bile when you eat a meal. A special nuclear medicine scanner (gamma camera) tracks the flow of the tracer from your liver into your gallbladder and small intestine.  During your HIDA scan  You'll be asked to change into a hospital gown before your HIDA scan begins. Your health care team will position you on a table, usually on your back. The radioactive tracer is then injected into a vein in your arm.The tracer travels through your bloodstream to your liver, where it's taken up by the bile-producing cells. The radioactive tracer travels with the bile from your liver into your gallbladder and through your bile ducts to your small intestine.You may feel some pressure while the radioactive tracer is injected into your vein. As you lie on the table, a special gamma camera is positioned over your abdomen taking pictures of the tracer as it moves through your body. The gamma camera takes pictures continually for about an hour. You'll need to keep still during the HIDA scan. This can become uncomfortable, but you may find that you can lessen the discomfort by  taking deep breaths and thinking about other things. Tell your health care team if you're uncomfortable. The radiologist will watch on a computer the progress of the radioactive tracer through your body. The HIDA scan may be stopped when the radioactive tracer is seen in the gallbladder and enters your small intestine. This typically takes about an hour. In some cases extra imaging will be performed if original images aren't satisfactory, if morphine is given to help visualize the gallbladder or if the medication CCK is given to look at the contraction of the gallbladder. This test typically takes 2 hours to complete. ________________________________________________________________________   Go to the basement for labs You have been scheduled for an endoscopy and colonoscopy. Please follow the written instructions given to you at your visit today. Please pick up your prep supplies at the pharmacy within the next 1-3 days. If you use inhalers (even only as needed), please bring them with you on the day of your procedure. Your physician has requested that you go to www.startemmi.com and enter the access code given to you at your visit today. This web site gives a general overview about your procedure. However, you should still follow specific instructions given to you by our office regarding your preparation for the procedure.  Dr Roderic Scarce

## 2014-07-07 LAB — GLIA (IGA/G) + TTG IGA
GLIADIN IGA: 8 U (ref <20)
GLIADIN IGG: 9 U (ref <20)
Tissue Transglutaminase Ab, IgA: 1 U/mL (ref <4)

## 2014-07-15 ENCOUNTER — Ambulatory Visit (HOSPITAL_COMMUNITY): Payer: Medicaid Other

## 2014-07-15 ENCOUNTER — Telehealth: Payer: Self-pay | Admitting: Internal Medicine

## 2014-07-15 NOTE — Telephone Encounter (Signed)
Patient given number to call to reschedule.

## 2014-08-03 ENCOUNTER — Encounter: Payer: Self-pay | Admitting: Internal Medicine

## 2014-08-03 ENCOUNTER — Ambulatory Visit (HOSPITAL_COMMUNITY): Payer: No Typology Code available for payment source

## 2014-08-19 ENCOUNTER — Encounter: Payer: Self-pay | Admitting: Internal Medicine

## 2014-08-25 ENCOUNTER — Encounter (HOSPITAL_COMMUNITY)
Admission: RE | Admit: 2014-08-25 | Discharge: 2014-08-25 | Disposition: A | Discharge status: Home / Self Care | Payer: Medicaid Other | Source: Ambulatory Visit | Attending: Internal Medicine | Admitting: Internal Medicine

## 2014-08-25 DIAGNOSIS — K8020 Calculus of gallbladder without cholecystitis without obstruction: Secondary | ICD-10-CM

## 2014-08-25 DIAGNOSIS — R110 Nausea: Secondary | ICD-10-CM | POA: Insufficient documentation

## 2014-08-25 DIAGNOSIS — R197 Diarrhea, unspecified: Secondary | ICD-10-CM | POA: Insufficient documentation

## 2014-08-26 NOTE — Progress Notes (Signed)
Pt did not have HIDA scan as scheduled. Will see Dr Silverio Decamp in October 2016.

## 2014-10-09 ENCOUNTER — Other Ambulatory Visit: Payer: Self-pay | Admitting: Internal Medicine

## 2014-10-14 ENCOUNTER — Ambulatory Visit (HOSPITAL_COMMUNITY): Payer: No Typology Code available for payment source

## 2014-10-21 ENCOUNTER — Ambulatory Visit (HOSPITAL_COMMUNITY)
Admission: RE | Admit: 2014-10-21 | Discharge: 2014-10-21 | Disposition: A | Discharge status: Home / Self Care | Payer: Medicaid Other | Source: Ambulatory Visit | Attending: Internal Medicine | Admitting: Internal Medicine

## 2014-10-21 ENCOUNTER — Other Ambulatory Visit (HOSPITAL_COMMUNITY): Payer: Self-pay | Admitting: Internal Medicine

## 2014-10-21 DIAGNOSIS — R1013 Epigastric pain: Secondary | ICD-10-CM

## 2014-10-21 DIAGNOSIS — R932 Abnormal findings on diagnostic imaging of liver and biliary tract: Secondary | ICD-10-CM | POA: Diagnosis not present

## 2014-10-21 DIAGNOSIS — R112 Nausea with vomiting, unspecified: Secondary | ICD-10-CM | POA: Diagnosis not present

## 2014-10-21 MED ORDER — TECHNETIUM TC 99M MEBROFENIN IV KIT
5.0000 | PACK | Freq: Once | INTRAVENOUS | Status: DC | PRN
  Administered 2014-10-21: 5.2 via INTRAVENOUS
  Filled 2014-10-21: qty 6

## 2014-10-27 ENCOUNTER — Encounter: Payer: Self-pay | Admitting: Gastroenterology

## 2014-10-27 ENCOUNTER — Ambulatory Visit (INDEPENDENT_AMBULATORY_CARE_PROVIDER_SITE_OTHER): Payer: Medicaid Other | Admitting: Gastroenterology

## 2014-10-27 VITALS — BP 120/76 | HR 76 | Ht 63.75 in | Wt 247.5 lb

## 2014-10-27 DIAGNOSIS — R932 Abnormal findings on diagnostic imaging of liver and biliary tract: Secondary | ICD-10-CM

## 2014-10-27 DIAGNOSIS — K5900 Constipation, unspecified: Secondary | ICD-10-CM

## 2014-10-27 DIAGNOSIS — G8929 Other chronic pain: Secondary | ICD-10-CM

## 2014-10-27 DIAGNOSIS — R1013 Epigastric pain: Secondary | ICD-10-CM

## 2014-10-27 DIAGNOSIS — K8020 Calculus of gallbladder without cholecystitis without obstruction: Secondary | ICD-10-CM | POA: Diagnosis not present

## 2014-10-27 MED ORDER — DICYCLOMINE HCL 10 MG PO CAPS: 10.0000 mg | ORAL_CAPSULE | Freq: Two times a day (BID) | ORAL | Status: DC

## 2014-10-27 MED ORDER — LINACLOTIDE 145 MCG PO CAPS
145.0000 ug | ORAL_CAPSULE | Freq: Every day | ORAL | Status: DC
Start: 2014-10-27 — End: 2014-10-29

## 2014-10-27 NOTE — Progress Notes (Signed)
Laura Kitchen                 Adja Kramer    [id number]    04-Jun-1975  Primary Care 2 M., MD  Referring Physician: Robyne Peers, MD [address] [place], [place] [address]  Chief complaint:  Epigastric pain  HPI: 39 year old African-American female is here for follow-up visit. She complains of intermittent episodes of epigastric abdominal pain associated with fullness and nausea. Her upper her abdominal ultrasound showed multiple small gallstones but otherwise normal-appearing gallbladder CBD was 3 mm. She had HIDA scan which showed decreased gallbladder ejection fraction of 26%. She denies persistent vomiting or diarrhea. She does have chronic constipation, has tried laxatives including MiraLAX over the counter stool softener with no significant improvement. She has a bowel movement about once every 4-5 days. Denies significant heartburn and acid taste or hoarseness of voice. Her mother has history of gallstones. No history of blood in stool or melena.   Outpatient Encounter Prescriptions as of 10/27/2014  Medication Sig  . ALPRAZolam (XANAX) 0.25 MG tablet   . dicyclomine (BENTYL) 10 MG capsule Take 1 capsule (10 mg total) by mouth 2 (two) times daily before a meal.  . frovatriptan (FROVA) 2.5 MG tablet Take 2.5 mg by mouth.  . Linaclotide (LINZESS) 145 MCG CAPS capsule Take 1 capsule (145 mcg total) by mouth daily.  . Na Sulfate-K Sulfate-Mg Sulf (SUPREP BOWEL PREP) SOLN Take 1 kit by mouth once.  Laura Kitchen omeprazole (PRILOSEC) 20 MG capsule Take 1 capsule (20 mg total) by mouth daily.  Laura Kitchen topiramate (TOPAMAX) 100 MG tablet   . [DISCONTINUED] dicyclomine (BENTYL) 10 MG capsule Take 1 capsule (10 mg total) by mouth 2 (two) times daily before a meal.  . [DISCONTINUED] dicyclomine (BENTYL) 20 MG tablet    No facility-administered encounter medications on file as of 10/27/2014.    Allergies as of 10/27/2014 - Review Complete 10/27/2014  Allergen Reaction Noted  .  Ciprofloxacin Rash 05/22/2012    Past Medical History  Diagnosis Date  . Migraine     Past Surgical History  Procedure Laterality Date  . Cesarean section      Family History  Problem Relation Age of Onset  . Prostate cancer Father   . Diabetes Father   . Diabetes Mother   . Heart disease Father   . Heart disease Mother   . Hypertension Father   . Hypertension Mother     Social History   Social History  . Marital Status: Single    Spouse Name: N/A  . Number of Children: N/A  . Years of Education: N/A   Occupational History  . Not on file.   Social History Main Topics  . Smoking status: Never Smoker   . Smokeless tobacco: Never Used  . Alcohol Use: No  . Drug Use: No  . Sexual Activity: No   Other Topics Concern  . Not on file   Social History Narrative      Review of systems: Review of Systems  Constitutional: Negative for fever and chills.  HENT: Negative.   Eyes: Negative for blurred vision.  Respiratory: Negative for cough, shortness of breath and wheezing.   Cardiovascular: Negative for chest pain and palpitations.  Gastrointestinal: as per HPI Genitourinary: Negative for dysuria, urgency, frequency and hematuria.  Musculoskeletal: Negative for myalgias, back pain and joint pain.  Skin: Negative for itching and rash.  Neurological: Negative for dizziness, tremors, focal weakness, seizures and loss of consciousness.  Endo/Heme/Allergies: Negative for environmental allergies.  Psychiatric/Behavioral: Negative for depression, suicidal ideas and hallucinations.  All other systems reviewed and are negative.   Physical Exam: Filed Vitals:   10/27/14 0900  BP: 120/76  Pulse: 76   Gen:      No acute distress HEENT:  EOMI, sclera anicteric Neck:     No masses; no thyromegaly Lungs:    Clear to auscultation bilaterally; normal respiratory effort CV:         Regular rate and rhythm; no murmurs Abd:      + bowel sounds; soft, non-tender; no  palpable masses, no distension Ext:    No edema; adequate peripheral perfusion Skin:      Warm and dry; no rash Neuro: alert and oriented x 3 Psych: normal mood and affect  Data Reviewed:  HIDA 10/21/14 Normal tracer extraction from bloodstream indicating normal hepatocellular function.  Normal excretion of tracer into biliary tree.  Gallbladder visualized at 38 18 min.  Small bowel visualized at min.  No hepatic retention of tracer.  Post CCK imaging limited by patient motion.  Subjectively decreased emptying of tracer from gallbladder following CCK administration.  Calculated gallbladder ejection fraction is 26%, below the normal range.  Patient reported no symptoms following CCK administration.  IMPRESSION: Patent biliary tree.  Abnormal gallbladder response to CCK stimulation with a decreased gallbladder ejection fraction of 26%.   Assessment and Plan/Recommendations: 39 year old female with complaints of epigastric abdominal pain associated with nausea and epigastric fullness. HIDA scan showed abnormal response of gallbladder to CCK stimulation with decreased gallbladder ejection fraction of 26% and she was noted to have gallstones on abdominal ultrasound Her symptoms could be related to decreased gallbladder ejection fraction along with gallstones. Will send referral  to [facility] surgeons for possible cholecystectomy. Chronic constipation: Start Linzess 145 g daily, advised patient to increase fluid and fiber intake Return in 3 months

## 2014-10-27 NOTE — Patient Instructions (Signed)
You have an appointment with Elwin Mocha at [facility] on 11/09/2014 at 3pm  We will send in your prescription of Linzess to your pharmacy

## 2014-10-28 ENCOUNTER — Other Ambulatory Visit: Payer: Self-pay | Admitting: *Deleted

## 2014-10-28 MED ORDER — DICYCLOMINE HCL 10 MG PO CAPS: 10.0000 mg | ORAL_CAPSULE | Freq: Two times a day (BID) | ORAL | Status: DC

## 2014-10-29 ENCOUNTER — Telehealth: Payer: Self-pay | Admitting: Gastroenterology

## 2014-10-29 MED ORDER — LINACLOTIDE 145 MCG PO CAPS: 145.0000 ug | ORAL_CAPSULE | Freq: Every day | ORAL | Status: DC

## 2014-10-29 NOTE — Telephone Encounter (Signed)
Patient called yesterday about dicyclomine and was sent under doc of the day which was Dr Henrene Pastor. Dr Silverio Decamp is not medicaid approved yet to send meds, Per Barbera Setters send under doc of the day. This morning Doc of the day is Nandigam all day so will send Linzess under Dr Henrene Pastor as well since originally patient called yesterday about the other med while Henrene Pastor was doc of the day.

## 2014-11-09 ENCOUNTER — Ambulatory Visit: Payer: Self-pay | Admitting: General Surgery

## 2014-11-09 NOTE — H&P (Signed)
[facility] Laura Kramer 11/09/2014 3:21 PM Location: [facility] Patient #: [id number] DOB: Apr 17, 1975 Married / Language: English / Race: Black or African American Female  History of Present Illness Laura Hollingshead MD; 11/09/2014 4:12 PM) The patient is a 39 year old female.   Note:She is referred by Dr. Silverio Decamp for further evaluation and treatment of gallbladder disease. In the past she was treated for C. difficile colitis. Following this she began having increasing episodes of postprandial epigastric pain with nausea and vomiting. Work up this summer included an ultrasound which demonstrated cholelithiasis, common bile duct diameter was normal, findings consistent with fatty liver were noted.. She also had a hepatobiliary scan which was consistent with biliary dyskinesia. Most her symptoms seem to follow a greasy or spicy meal. There is a family history of gallbladder disease.  Other Problems Laura Kramer, [place]; 11/09/2014 3:21 PM) Gastroesophageal Reflux Disease  Diagnostic Studies History Laura Kramer, [place]; 11/09/2014 3:21 PM) Colonoscopy never  Allergies Laura Kramer, CMA; 11/09/2014 3:23 PM) Ciprofloxacin *CHEMICALS* Rash.  Medication History Laura Kramer, CMA; 11/09/2014 3:23 PM) Omeprazole (20MG  Capsule DR, Oral daily) Active. Topiramate (100MG  Tablet, Oral daily) Active. Frovatriptan Succinate (2.5MG  Tablet, Oral daily) Active. Linzess (145MCG Capsule, Oral daily) Active. Medications Reconciled  Social History Laura Kramer, [place]; 11/09/2014 3:21 PM) Tobacco use Never smoker.     Review of Systems ([place]; 11/09/2014 3:21 PM) General Present- Fatigue and Weight Gain. Not Present- Appetite Loss, Chills, Fever, Night Sweats and Weight Loss. HEENT Present- Hoarseness. Not Present- Earache, Hearing Loss, Nose Bleed, Oral Ulcers, Ringing in the Ears, Seasonal Allergies, Sinus Pain, Sore Throat, Visual Disturbances, Wears glasses/contact  lenses and Yellow Eyes. Gastrointestinal Present- Abdominal Pain. Not Present- Bloating, Bloody Stool, Change in Bowel Habits, Chronic diarrhea, Constipation, Difficulty Swallowing, Excessive gas, Gets full quickly at meals, Hemorrhoids, Indigestion, Nausea, Rectal Pain and Vomiting. Female Genitourinary Present- Urgency. Not Present- Frequency, Nocturia, Painful Urination and Pelvic Pain. Musculoskeletal Not Present- Back Pain, Joint Pain, Joint Stiffness, Muscle Pain, Muscle Weakness and Swelling of Extremities.  Vitals Laura Kramer CMA; 11/09/2014 3:24 PM) 11/09/2014 3:23 PM Weight: 245.6 lb Height: 62in Body Surface Area: 2.09 m Body Mass Index: 44.92 kg/m  Temp.: 98.73F(Oral)  Pulse: 76 (Regular)  BP: 120/84 (Sitting, Left Arm, Standard)      Physical Exam Laura Hollingshead MD; 11/09/2014 4:13 PM)  The physical exam findings are as follows: Note:General: Obese female in NAD. Pleasant and cooperative.  HEENT: Laura Kramer/AT, no facial masses  EYES: EOMI, no icterus  CV: RRR, no murmur, no JVD.  CHEST: Breath sounds equal and clear. Respirations nonlabored.  ABDOMEN: Soft, nontender, nondistended, no masses, no organomegaly, lower transverse scar.  NEUROLOGIC: Alert and oriented, answers questions appropriately, normal gait and station.  PSYCHIATRIC: Normal mood, affect , and behavior.    Assessment & Plan Laura Hollingshead MD; 11/09/2014 4:09 PM)  SYMPTOMATIC CHOLELITHIASIS (K80.20) Impression: Most of her symptoms seemed to follow a greasy or spicy meal.  Plan: Laparoscopic cholecystectomy with cholangiogram. I have explained the procedure, risks, and aftercare of cholecystectomy. Risks include but are not limited to bleeding, infection, wound problems, anesthesia, diarrhea, bile leak, injury to common bile duct/liver/intestine. She seems to understand and agrees to proceed.  Current Plans Schedule for Surgery Pt Education - Pamphlet Given - Laparoscopic  Gallbladder Surgery: discussed with patient and provided information. Pt Education - CCS Free Text Education/Instructions: discussed with patient and provided information. BILIARY DYSKINESIA (K82.8)  Jackolyn Confer, MD

## 2014-11-23 NOTE — Pre-Procedure Instructions (Addendum)
    [facility]  11/23/2014      [id number] - [place], [place] - [address] [place]  [address] Phone: [phone] Fax: [phone]    Your procedure is scheduled on Tuesday, November 29.              Report to [facility] Admitting at 7:30 A.M.              Your surgery is scheduled for 9:30 A.M.   Call this number if you have problems the morning of surgery:[phone]   Remember:  Do not eat food or drink liquids after midnight Monday, November 28.  Take these medicines the morning of surgery with A SIP OF WATER :omeprazole (PRILOSEC), topiramate (TOPAMAX).                May take frovatriptan (FROVA), ondansetron (ZOFRAN-ODT)  May use Albuterol inhaler.                 Stop taking Aspirin, Coumadin, Plavix, Effient and Herbal medications.  Do not take any NSAIDs ie: Ibuprofen,  Advil,Naproxen or any medication containing Aspirin.   Do not wear jewelry, make-up or nail polish.   Do not wear lotions, powders, or perfumes.    Do not shave 48 hours prior to surgery.     Do not bring valuables to the hospital.   [facility] is not responsible for any belongings or valuables.  Contacts, dentures or bridgework may not be worn into surgery.  Leave your suitcase in the car.  After surgery it may be brought to your room.  For patients admitted to the hospital, discharge time will be determined by your treatment team.  Patients discharged the day of surgery will not be allowed to drive home.   Name and phone number of your driver:    Special instructions: Review  [facility] - Preparing For Surgery.  Please read over the following fact sheets that you were given. Pain Booklet, Coughing and Deep Breathing and Surgical Site Infection Prevention

## 2014-11-24 ENCOUNTER — Encounter (HOSPITAL_COMMUNITY): Payer: Self-pay

## 2014-11-24 ENCOUNTER — Encounter (HOSPITAL_COMMUNITY)
Admission: RE | Admit: 2014-11-24 | Discharge: 2014-11-24 | Disposition: A | Discharge status: Home / Self Care | Payer: Medicaid Other | Source: Ambulatory Visit | Attending: General Surgery | Admitting: General Surgery

## 2014-11-24 DIAGNOSIS — Z01812 Encounter for preprocedural laboratory examination: Secondary | ICD-10-CM | POA: Insufficient documentation

## 2014-11-24 DIAGNOSIS — K8020 Calculus of gallbladder without cholecystitis without obstruction: Secondary | ICD-10-CM | POA: Diagnosis not present

## 2014-11-24 HISTORY — DX: Bronchitis, not specified as acute or chronic: J40

## 2014-11-24 LAB — CBC WITH DIFFERENTIAL/PLATELET
BASOS PCT: 1 %
Basophils Absolute: 0.1 10*3/uL (ref 0.0–0.1)
EOS ABS: 0.4 10*3/uL (ref 0.0–0.7)
EOS PCT: 4 %
HEMATOCRIT: 38.2 % (ref 36.0–46.0)
Hemoglobin: 12.6 g/dL (ref 12.0–15.0)
Lymphocytes Relative: 32 %
Lymphs Abs: 3.4 10*3/uL (ref 0.7–4.0)
MCH: 26.1 pg (ref 26.0–34.0)
MCHC: 33 g/dL (ref 30.0–36.0)
MCV: 79.3 fL (ref 78.0–100.0)
MONO ABS: 0.6 10*3/uL (ref 0.1–1.0)
MONOS PCT: 5 %
NEUTROS ABS: 6.3 10*3/uL (ref 1.7–7.7)
Neutrophils Relative %: 58 %
PLATELETS: 366 10*3/uL (ref 150–400)
RBC: 4.82 MIL/uL (ref 3.87–5.11)
RDW: 15.5 % (ref 11.5–15.5)
WBC: 10.7 10*3/uL — ABNORMAL HIGH (ref 4.0–10.5)

## 2014-11-24 LAB — COMPREHENSIVE METABOLIC PANEL
ALBUMIN: 4 g/dL (ref 3.5–5.0)
ALT: 23 U/L (ref 14–54)
ANION GAP: 8 (ref 5–15)
AST: 19 U/L (ref 15–41)
Alkaline Phosphatase: 64 U/L (ref 38–126)
BILIRUBIN TOTAL: 0.2 mg/dL — AB (ref 0.3–1.2)
BUN: 11 mg/dL (ref 6–20)
CHLORIDE: 107 mmol/L (ref 101–111)
CO2: 26 mmol/L (ref 22–32)
Calcium: 9.3 mg/dL (ref 8.9–10.3)
Creatinine, Ser: 0.85 mg/dL (ref 0.44–1.00)
GFR calc Af Amer: >60 mL/min (ref >60)
GFR calc non Af Amer: >60 mL/min (ref >60)
GLUCOSE: 133 mg/dL — AB (ref 65–99)
POTASSIUM: 3.9 mmol/L (ref 3.5–5.1)
Sodium: 141 mmol/L (ref 135–145)
TOTAL PROTEIN: 7.2 g/dL (ref 6.5–8.1)

## 2014-11-24 LAB — HCG, SERUM, QUALITATIVE: PREG SERUM: NEGATIVE

## 2014-11-24 LAB — PROTIME-INR
INR: 1.03 (ref 0.00–1.49)
PROTHROMBIN TIME: 13.7 s (ref 11.6–15.2)

## 2014-11-30 MED ORDER — CEFAZOLIN SODIUM-DEXTROSE 2-3 GM-% IV SOLR
2.0000 g | INTRAVENOUS | Status: AC
  Administered 2014-12-01: 2 g via INTRAVENOUS
  Filled 2014-11-30: qty 50

## 2014-12-01 ENCOUNTER — Ambulatory Visit (HOSPITAL_COMMUNITY): Payer: Medicaid Other | Admitting: Certified Registered Nurse Anesthetist

## 2014-12-01 ENCOUNTER — Encounter (HOSPITAL_COMMUNITY)
Admission: RE | Disposition: A | Discharge status: Home / Self Care | Payer: Self-pay | Source: Ambulatory Visit | Attending: General Surgery

## 2014-12-01 ENCOUNTER — Ambulatory Visit (HOSPITAL_COMMUNITY)
Admission: RE | Admit: 2014-12-01 | Discharge: 2014-12-01 | Disposition: A | Discharge status: Home / Self Care | Payer: Medicaid Other | Source: Ambulatory Visit | Attending: General Surgery | Admitting: General Surgery

## 2014-12-01 ENCOUNTER — Ambulatory Visit (HOSPITAL_COMMUNITY): Payer: Medicaid Other

## 2014-12-01 DIAGNOSIS — K8020 Calculus of gallbladder without cholecystitis without obstruction: Secondary | ICD-10-CM | POA: Diagnosis not present

## 2014-12-01 DIAGNOSIS — Z79899 Other long term (current) drug therapy: Secondary | ICD-10-CM | POA: Insufficient documentation

## 2014-12-01 DIAGNOSIS — K219 Gastro-esophageal reflux disease without esophagitis: Secondary | ICD-10-CM | POA: Diagnosis not present

## 2014-12-01 DIAGNOSIS — Z6841 Body Mass Index (BMI) 40.0 and over, adult: Secondary | ICD-10-CM | POA: Diagnosis not present

## 2014-12-01 HISTORY — PX: CHOLECYSTECTOMY: SHX55

## 2014-12-01 SURGERY — LAPAROSCOPIC CHOLECYSTECTOMY WITH INTRAOPERATIVE CHOLANGIOGRAM
Anesthesia: General | Site: Abdomen

## 2014-12-01 MED ORDER — LACTATED RINGERS IV SOLN
INTRAVENOUS | Status: DC
  Administered 2014-12-01 (×2): via INTRAVENOUS

## 2014-12-01 MED ORDER — EPHEDRINE SULFATE 50 MG/ML IJ SOLN
INTRAMUSCULAR | Status: AC
  Filled 2014-12-01: qty 1

## 2014-12-01 MED ORDER — METOCLOPRAMIDE HCL 5 MG/ML IJ SOLN
INTRAMUSCULAR | Status: DC | PRN
  Administered 2014-12-01: 10 mg via INTRAVENOUS

## 2014-12-01 MED ORDER — METOCLOPRAMIDE HCL 5 MG/ML IJ SOLN
INTRAMUSCULAR | Status: AC
  Filled 2014-12-01: qty 2

## 2014-12-01 MED ORDER — OXYCODONE HCL 5 MG PO TABS
ORAL_TABLET | ORAL | Status: AC
  Filled 2014-12-01: qty 2

## 2014-12-01 MED ORDER — ONDANSETRON HCL 4 MG/2ML IJ SOLN
INTRAMUSCULAR | Status: DC | PRN
  Administered 2014-12-01: 4 mg via INTRAVENOUS

## 2014-12-01 MED ORDER — PROPOFOL 10 MG/ML IV BOLUS
INTRAVENOUS | Status: AC
  Filled 2014-12-01: qty 20

## 2014-12-01 MED ORDER — SUCCINYLCHOLINE CHLORIDE 20 MG/ML IJ SOLN
INTRAMUSCULAR | Status: AC
  Filled 2014-12-01: qty 1

## 2014-12-01 MED ORDER — LIDOCAINE HCL (CARDIAC) 20 MG/ML IV SOLN
INTRAVENOUS | Status: AC
  Filled 2014-12-01: qty 5

## 2014-12-01 MED ORDER — ONDANSETRON HCL 4 MG PO TABS: 4.0000 mg | ORAL_TABLET | ORAL | Status: DC | PRN

## 2014-12-01 MED ORDER — MIDAZOLAM HCL 2 MG/2ML IJ SOLN
INTRAMUSCULAR | Status: AC
  Filled 2014-12-01: qty 2

## 2014-12-01 MED ORDER — ONDANSETRON HCL 4 MG/2ML IJ SOLN
INTRAMUSCULAR | Status: AC
  Filled 2014-12-01: qty 2

## 2014-12-01 MED ORDER — BUPIVACAINE-EPINEPHRINE 0.25% -1:200000 IJ SOLN
INTRAMUSCULAR | Status: DC | PRN
  Administered 2014-12-01: 30 mL

## 2014-12-01 MED ORDER — DEXAMETHASONE SODIUM PHOSPHATE 10 MG/ML IJ SOLN
INTRAMUSCULAR | Status: DC | PRN
  Administered 2014-12-01: 10 mg via INTRAVENOUS

## 2014-12-01 MED ORDER — SODIUM CHLORIDE 0.9 % IR SOLN
Status: DC | PRN
  Administered 2014-12-01 (×3): 1000 mL

## 2014-12-01 MED ORDER — PROMETHAZINE HCL 25 MG/ML IJ SOLN: 6.2500 mg | INTRAMUSCULAR | Status: DC | PRN

## 2014-12-01 MED ORDER — KETOROLAC TROMETHAMINE 30 MG/ML IJ SOLN
30.0000 mg | Freq: Once | INTRAMUSCULAR | Status: AC
  Administered 2014-12-01: 30 mg via INTRAVENOUS

## 2014-12-01 MED ORDER — SUGAMMADEX SODIUM 200 MG/2ML IV SOLN
INTRAVENOUS | Status: DC | PRN
  Administered 2014-12-01: 200 mg via INTRAVENOUS

## 2014-12-01 MED ORDER — FENTANYL CITRATE (PF) 100 MCG/2ML IJ SOLN
INTRAMUSCULAR | Status: DC | PRN
  Administered 2014-12-01: 100 ug via INTRAVENOUS
  Administered 2014-12-01 (×2): 50 ug via INTRAVENOUS

## 2014-12-01 MED ORDER — OXYCODONE HCL 5 MG PO TABS: 5.0000 mg | ORAL_TABLET | ORAL | Status: DC | PRN

## 2014-12-01 MED ORDER — PHENYLEPHRINE 40 MCG/ML (10ML) SYRINGE FOR IV PUSH (FOR BLOOD PRESSURE SUPPORT)
PREFILLED_SYRINGE | INTRAVENOUS | Status: AC
  Filled 2014-12-01: qty 10

## 2014-12-01 MED ORDER — DEXAMETHASONE SODIUM PHOSPHATE 10 MG/ML IJ SOLN
INTRAMUSCULAR | Status: AC
  Filled 2014-12-01: qty 1

## 2014-12-01 MED ORDER — 0.9 % SODIUM CHLORIDE (POUR BTL) OPTIME
TOPICAL | Status: DC | PRN
  Administered 2014-12-01: 1000 mL

## 2014-12-01 MED ORDER — ROCURONIUM BROMIDE 50 MG/5ML IV SOLN
INTRAVENOUS | Status: AC
  Filled 2014-12-01: qty 1

## 2014-12-01 MED ORDER — ROCURONIUM BROMIDE 100 MG/10ML IV SOLN
INTRAVENOUS | Status: DC | PRN
  Administered 2014-12-01: 50 mg via INTRAVENOUS

## 2014-12-01 MED ORDER — HYDROMORPHONE HCL 1 MG/ML IJ SOLN
0.2500 mg | INTRAMUSCULAR | Status: DC | PRN
  Administered 2014-12-01: 1 mg via INTRAVENOUS

## 2014-12-01 MED ORDER — SUGAMMADEX SODIUM 200 MG/2ML IV SOLN
INTRAVENOUS | Status: AC
Start: 2014-12-01 — End: 2014-12-01
  Filled 2014-12-01: qty 2

## 2014-12-01 MED ORDER — FENTANYL CITRATE (PF) 250 MCG/5ML IJ SOLN
INTRAMUSCULAR | Status: AC
  Filled 2014-12-01: qty 5

## 2014-12-01 MED ORDER — HEMOSTATIC AGENTS (NO CHARGE) OPTIME
TOPICAL | Status: DC | PRN
  Administered 2014-12-01: 1 via TOPICAL

## 2014-12-01 MED ORDER — SODIUM CHLORIDE 0.9 % IV SOLN
INTRAVENOUS | Status: DC | PRN
  Administered 2014-12-01: 9 mL

## 2014-12-01 MED ORDER — PROPOFOL 10 MG/ML IV BOLUS
INTRAVENOUS | Status: DC | PRN
  Administered 2014-12-01: 200 mg via INTRAVENOUS

## 2014-12-01 MED ORDER — LIDOCAINE HCL (CARDIAC) 20 MG/ML IV SOLN
INTRAVENOUS | Status: DC | PRN
  Administered 2014-12-01: 60 mg via INTRAVENOUS

## 2014-12-01 MED ORDER — HYDROMORPHONE HCL 1 MG/ML IJ SOLN
INTRAMUSCULAR | Status: AC
  Filled 2014-12-01: qty 1

## 2014-12-01 MED ORDER — MIDAZOLAM HCL 5 MG/5ML IJ SOLN
INTRAMUSCULAR | Status: DC | PRN
  Administered 2014-12-01: 2 mg via INTRAVENOUS

## 2014-12-01 MED ORDER — OXYCODONE HCL 5 MG PO TABS
5.0000 mg | ORAL_TABLET | ORAL | Status: DC | PRN
  Administered 2014-12-01: 10 mg via ORAL

## 2014-12-01 MED ORDER — MORPHINE SULFATE (PF) 2 MG/ML IV SOLN: 2.0000 mg | INTRAVENOUS | Status: DC | PRN

## 2014-12-01 MED ORDER — KETOROLAC TROMETHAMINE 30 MG/ML IJ SOLN
INTRAMUSCULAR | Status: AC
  Administered 2014-12-01: 30 mg
  Filled 2014-12-01: qty 1

## 2014-12-01 SURGICAL SUPPLY — 51 items
APL SKNCLS STERI-STRIP NONHPOA (GAUZE/BANDAGES/DRESSINGS) ×1
APPLIER CLIP 5 13 M/L LIGAMAX5 (MISCELLANEOUS) ×3
APR CLP MED LRG 5 ANG JAW (MISCELLANEOUS) ×1
BAG SPEC RTRVL LRG 6X4 10 (ENDOMECHANICALS) ×1
BENZOIN TINCTURE PRP APPL 2/3 (GAUZE/BANDAGES/DRESSINGS) ×3 IMPLANT
CANISTER SUCTION 2500CC (MISCELLANEOUS) ×3 IMPLANT
CATH REDDICK CHOLANGI 4FR 50CM (CATHETERS) ×3 IMPLANT
CHLORAPREP W/TINT 26ML (MISCELLANEOUS) ×3 IMPLANT
CLIP APPLIE 5 13 M/L LIGAMAX5 (MISCELLANEOUS) ×1 IMPLANT
CLOSURE WOUND 1/2 X4 (GAUZE/BANDAGES/DRESSINGS) ×1
COVER MAYO STAND STRL (DRAPES) ×3 IMPLANT
COVER SURGICAL LIGHT HANDLE (MISCELLANEOUS) ×3 IMPLANT
DECANTER SPIKE VIAL GLASS SM (MISCELLANEOUS) ×3 IMPLANT
DRAPE C-ARM 42X72 X-RAY (DRAPES) ×3 IMPLANT
DRSG TEGADERM 2-3/8X2-3/4 SM (GAUZE/BANDAGES/DRESSINGS) ×12 IMPLANT
ELECT REM PT RETURN 9FT ADLT (ELECTROSURGICAL) ×3
ELECTRODE REM PT RTRN 9FT ADLT (ELECTROSURGICAL) ×1 IMPLANT
GAUZE SPONGE 2X2 8PLY STRL LF (GAUZE/BANDAGES/DRESSINGS) ×1 IMPLANT
GLOVE BIO SURGEON STRL SZ7 (GLOVE) ×6 IMPLANT
GLOVE BIOGEL PI IND STRL 7.0 (GLOVE) IMPLANT
GLOVE BIOGEL PI IND STRL 7.5 (GLOVE) IMPLANT
GLOVE BIOGEL PI IND STRL 8 (GLOVE) ×1 IMPLANT
GLOVE BIOGEL PI INDICATOR 7.0 (GLOVE) ×4
GLOVE BIOGEL PI INDICATOR 7.5 (GLOVE) ×4
GLOVE BIOGEL PI INDICATOR 8 (GLOVE) ×2
GLOVE ECLIPSE 7.5 STRL STRAW (GLOVE) ×2 IMPLANT
GLOVE ECLIPSE 8.0 STRL XLNG CF (GLOVE) ×5 IMPLANT
GOWN STRL REUS W/ TWL LRG LVL3 (GOWN DISPOSABLE) ×3 IMPLANT
GOWN STRL REUS W/TWL LRG LVL3 (GOWN DISPOSABLE) ×9
HEMOSTAT SNOW SURGICEL 2X4 (HEMOSTASIS) ×2 IMPLANT
IV CATH 14GX2 1/4 (CATHETERS) ×3 IMPLANT
KIT BASIN OR (CUSTOM PROCEDURE TRAY) ×3 IMPLANT
KIT ROOM TURNOVER OR (KITS) ×3 IMPLANT
NS IRRIG 1000ML POUR BTL (IV SOLUTION) ×3 IMPLANT
PAD ARMBOARD 7.5X6 YLW CONV (MISCELLANEOUS) ×3 IMPLANT
POUCH SPECIMEN RETRIEVAL 10MM (ENDOMECHANICALS) ×3 IMPLANT
SCISSORS LAP 5X35 DISP (ENDOMECHANICALS) ×3 IMPLANT
SET IRRIG TUBING LAPAROSCOPIC (IRRIGATION / IRRIGATOR) ×3 IMPLANT
SLEEVE ENDOPATH XCEL 5M (ENDOMECHANICALS) ×6 IMPLANT
SPECIMEN JAR SMALL (MISCELLANEOUS) ×3 IMPLANT
SPONGE GAUZE 2X2 STER 10/PKG (GAUZE/BANDAGES/DRESSINGS) ×2
STRIP CLOSURE SKIN 1/2X4 (GAUZE/BANDAGES/DRESSINGS) ×2 IMPLANT
SUT MNCRL AB 4-0 PS2 18 (SUTURE) ×2 IMPLANT
SUT MON AB 4-0 PC3 18 (SUTURE) ×3 IMPLANT
TOWEL OR 17X24 6PK STRL BLUE (TOWEL DISPOSABLE) ×3 IMPLANT
TOWEL OR 17X26 10 PK STRL BLUE (TOWEL DISPOSABLE) ×3 IMPLANT
TRAY LAPAROSCOPIC MC (CUSTOM PROCEDURE TRAY) ×3 IMPLANT
TROCAR XCEL BLUNT TIP 100MML (ENDOMECHANICALS) ×3 IMPLANT
TROCAR XCEL NON-BLD 11X100MML (ENDOMECHANICALS) IMPLANT
TROCAR XCEL NON-BLD 5MMX100MML (ENDOMECHANICALS) ×3 IMPLANT
TUBING INSUFFLATION (TUBING) ×3 IMPLANT

## 2014-12-01 NOTE — H&P (View-Only) (Signed)
Laura Kramer 11/09/2014 3:21 PM Location: [facility] Patient #: [id number] DOB: 05/14/1975 Married / Language: English / Race: Black or African American Female  History of Present Illness ([name] [name]; 11/09/2014 4:12 PM) The patient is a 39 year old female.   Note:She is referred by Dr. Nandigam for further evaluation and treatment of gallbladder disease. In the past she was treated for C. difficile colitis. Following this she began having increasing episodes of postprandial epigastric pain with nausea and vomiting. Work up this summer included an ultrasound which demonstrated cholelithiasis, common bile duct diameter was normal, findings consistent with fatty liver were noted.. She also had a hepatobiliary scan which was consistent with biliary dyskinesia. Most her symptoms seem to follow a greasy or spicy meal. There is a family history of gallbladder disease.  Other Problems ([name], CMA; 11/09/2014 3:21 PM) Gastroesophageal Reflux Disease  Diagnostic Studies History ([name], CMA; 11/09/2014 3:21 PM) Colonoscopy never  Allergies ([name], CMA; 11/09/2014 3:23 PM) Ciprofloxacin *CHEMICALS* Rash.  Medication History ([name], CMA; 11/09/2014 3:23 PM) Omeprazole (20MG Capsule DR, Oral daily) Active. Topiramate (100MG Tablet, Oral daily) Active. Frovatriptan Succinate (2.5MG Tablet, Oral daily) Active. Linzess (145MCG Capsule, Oral daily) Active. Medications Reconciled  Social History ([name], CMA; 11/09/2014 3:21 PM) Tobacco use Never smoker.     Review of Systems ([name] CMA; 11/09/2014 3:21 PM) General Present- Fatigue and Weight Gain. Not Present- Appetite Loss, Chills, Fever, Night Sweats and Weight Loss. HEENT Present- Hoarseness. Not Present- Earache, Hearing Loss, Nose Bleed, Oral Ulcers, Ringing in the Ears, Seasonal Allergies, Sinus Pain, Sore Throat, Visual Disturbances, Wears glasses/contact  lenses and Yellow Eyes. Gastrointestinal Present- Abdominal Pain. Not Present- Bloating, Bloody Stool, Change in Bowel Habits, Chronic diarrhea, Constipation, Difficulty Swallowing, Excessive gas, Gets full quickly at meals, Hemorrhoids, Indigestion, Nausea, Rectal Pain and Vomiting. Female Genitourinary Present- Urgency. Not Present- Frequency, Nocturia, Painful Urination and Pelvic Pain. Musculoskeletal Not Present- Back Pain, Joint Pain, Joint Stiffness, Muscle Pain, Muscle Weakness and Swelling of Extremities.  Vitals ([name] CMA; 11/09/2014 3:24 PM) 11/09/2014 3:23 PM Weight: 245.6 lb Height: 62in Body Surface Area: 2.09 m Body Mass Index: 44.92 kg/m  Temp.: 98.4F(Oral)  Pulse: 76 (Regular)  BP: 120/84 (Sitting, Left Arm, Standard)      Physical Exam ([name]; 11/09/2014 4:13 PM)  The physical exam findings are as follows: Note:General: Obese female in NAD. Pleasant and cooperative.  HEENT: [place]/AT, no facial masses  EYES: EOMI, no icterus  CV: RRR, no murmur, no JVD.  CHEST: Breath sounds equal and clear. Respirations nonlabored.  ABDOMEN: Soft, nontender, nondistended, no masses, no organomegaly, lower transverse scar.  NEUROLOGIC: Alert and oriented, answers questions appropriately, normal gait and station.  PSYCHIATRIC: Normal mood, affect , and behavior.    Assessment & Plan ([name]; 11/09/2014 4:09 PM)  SYMPTOMATIC CHOLELITHIASIS (K80.20) Impression: Most of her symptoms seemed to follow a greasy or spicy meal.  Plan: Laparoscopic cholecystectomy with cholangiogram. I have explained the procedure, risks, and aftercare of cholecystectomy. Risks include but are not limited to bleeding, infection, wound problems, anesthesia, diarrhea, bile leak, injury to common bile duct/liver/intestine. She seems to understand and agrees to proceed.  Current Plans Schedule for Surgery Pt Education - Pamphlet Given - Laparoscopic  Gallbladder Surgery: discussed with patient and provided information. Pt Education - CCS Free Text Education/Instructions: discussed with patient and provided information. BILIARY DYSKINESIA (K82.8)  [name], MD 

## 2014-12-01 NOTE — Anesthesia Procedure Notes (Signed)
Procedure Name: Intubation Date/Time: 12/01/2014 9:24 AM Performed by: Rejeana Brock L Pre-anesthesia Checklist: Patient identified, Timeout performed, Emergency Drugs available, Suction available and Patient being monitored Patient Re-evaluated:Patient Re-evaluated prior to inductionOxygen Delivery Method: Circle system utilized Preoxygenation: Pre-oxygenation with 100% oxygen Intubation Type: IV induction Ventilation: Mask ventilation without difficulty Laryngoscope Size: Mac and 4 Grade View: Grade II Tube type: Oral Tube size: 7.0 mm Number of attempts: 1 Airway Equipment and Method: Stylet (ramped with blankets) Placement Confirmation: ETT inserted through vocal cords under direct vision,  positive ETCO2 and breath sounds checked- equal and bilateral Secured at: 22 cm Tube secured with: Tape Dental Injury: Teeth and Oropharynx as per pre-operative assessment

## 2014-12-01 NOTE — Transfer of Care (Signed)
Immediate Anesthesia Transfer of Care Note  Patient: Laura Kramer  Procedure(s) Performed: Procedure(s): LAPAROSCOPIC CHOLECYSTECTOMY WITH INTRAOPERATIVE CHOLANGIOGRAM (N/A)  Patient Location: PACU  Anesthesia Type:General  Level of Consciousness: awake, alert  and oriented  Airway & Oxygen Therapy: Patient Spontanous Breathing and Patient connected to face mask oxygen  Post-op Assessment: Report given to RN and Post -op Vital signs reviewed and stable  Post vital signs: stable  Last Vitals:  Filed Vitals:   12/01/14 0758 12/01/14 0759  BP:  104/71  Pulse: 63   Temp: 36.8 C   Resp: 20     Complications: No apparent anesthesia complications

## 2014-12-01 NOTE — Anesthesia Postprocedure Evaluation (Signed)
Anesthesia Post Note  Patient: Laura Kramer  Procedure(s) Performed: Procedure(s) (LRB): LAPAROSCOPIC CHOLECYSTECTOMY WITH INTRAOPERATIVE CHOLANGIOGRAM (N/A)  Patient location during evaluation: PACU Anesthesia Type: General Level of consciousness: awake and alert Pain management: pain level controlled Vital Signs Assessment: post-procedure vital signs reviewed and stable Respiratory status: spontaneous breathing, nonlabored ventilation, respiratory function stable and patient connected to nasal cannula oxygen Cardiovascular status: blood pressure returned to baseline and stable Postop Assessment: no signs of nausea or vomiting Anesthetic complications: no    Last Vitals:  Filed Vitals:   12/01/14 1045 12/01/14 1057  BP:    Pulse:  83  Temp: 36.6 C   Resp:  21    Last Pain:  Filed Vitals:   12/01/14 1128  PainSc: Asleep                 Eleonor Ocon S

## 2014-12-01 NOTE — Interval H&P Note (Signed)
History and Physical Interval Note:  12/01/2014 7:31 AM  Laura Kramer  has presented today for surgery, with the diagnosis of Symptomatic cholelithiasis  The various methods of treatment have been discussed with the patient and family. After consideration of risks, benefits and other options for treatment, the patient has consented to  Procedure(s): LAPAROSCOPIC CHOLECYSTECTOMY WITH INTRAOPERATIVE CHOLANGIOGRAM (N/A) as a surgical intervention .  The patient's history has been reviewed, patient examined, no change in status, stable for surgery.  I have reviewed the patient's chart and labs.  Questions were answered to the patient's satisfaction.     Ita Fritzsche Lenna Sciara

## 2014-12-01 NOTE — Discharge Instructions (Signed)
LAPAROSCOPIC SURGERY: POST OP INSTRUCTIONS  1. DIET: Follow a liquid diet the first 48 hours after arrival home, such as soup, liquids, crackers, plain toast, etc.  Be sure to include lots of fluids daily.  Avoid fast food or heavy meals as your are more likely to get nauseated.  Eat a low fat diet starting Thursday.   2. Take your usually prescribed home medications unless otherwise directed. 3. PAIN CONTROL: a. Pain is best controlled by a usual combination of three different methods TOGETHER: i. Ice/Heat ii. Over the counter pain medication iii. Prescription pain medication b. Most patients will experience some swelling and bruising around the incisions.  Ice packs or heating pads (30-60 minutes up to 6 times a day) will help. Use ice for the first few days to help decrease swelling and bruising, then switch to heat to help relax tight/sore spots and speed recovery.  Some people prefer to use ice alone, heat alone, alternating between ice & heat.  Experiment to what works for you.  Swelling and bruising can take several weeks to resolve.   c. It is helpful to take an over-the-counter pain medication regularly for the first few weeks.  Choose one of the following that works best for you: i. Naproxen (Aleve, etc)  Two 220mg  tabs twice a day ii. Ibuprofen (Advil, etc) Three 200mg  tabs four times a day (every meal & bedtime) iii. Acetaminophen (Tylenol, etc) 500-650mg  four times a day (every meal & bedtime) d. A  prescription for pain medication (such as oxycodone, hydrocodone, etc) should be given to you upon discharge.  Take your pain medication as prescribed.  i. If you are having problems/concerns with the prescription medicine (does not control pain, nausea, vomiting, rash, itching, etc), please call us [phone] to see if we need to switch you to a different pain medicine that will work better for you and/or control your side effect better. ii. If you need a refill on your pain medication,  please contact your pharmacy.  They will contact our office to request authorization. Prescriptions will not be filled after 5 pm or on week-ends. 4. Avoid getting constipated.  Between the surgery and the pain medications, it is common to experience some constipation.  Increasing fluid intake and taking a fiber supplement (such as Metamucil, Citrucel, FiberCon, MiraLax, etc) 1-2 times a day regularly will usually help prevent this problem from occurring.  A mild laxative (prune juice, Milk of Magnesia, MiraLax, etc) should be taken according to package directions if there are no bowel movements after 48 hours.   5. Watch out for diarrhea.  If you have many loose bowel movements, simplify your diet to bland foods & liquids for a few days.  Stop any stool softeners and decrease your fiber supplement.  Switching to mild anti-diarrheal medications (Kayopectate, Pepto Bismol) can help.  If this worsens or does not improve, please call us. 6. Wash / shower every day.  You may shower over the dressings as they are waterproof.  Continue to shower over incision(s) after the dressing is off. 7. Remove your waterproof bandages 3 days after surgery.  You may leave the incision open to air.  You may replace a dressing/Band-Aid to cover the incision for comfort if you wish.  8. ACTIVITIES as tolerated:   a. You may resume regular (light) daily activities beginning the next day--such as daily self-care, walking, climbing stairs--gradually increasing light activities as tolerated.  No heavy lifting (over 10 pounds), straining, or intense activities  for 2 weeks. b. DO NOT PUSH THROUGH PAIN.  Let pain be your guide: If it hurts to do something, don't do it.  Pain is your body warning you to avoid that activity for another week until the pain goes down. c. You may drive when you are no longer taking prescription pain medication, you can comfortably wear a seatbelt, and you can safely maneuver your car and apply  brakes. d. Dennis Bast may have sexual intercourse when it is comfortable.  9. FOLLOW UP in our office a. Please call [facility] at [phone] to set up an appointment to see your surgeon in the office for a follow-up appointment approximately 2-3 weeks after your surgery. b. Make sure that you call for this appointment the day you arrive home to insure a convenient appointment time. 10. IF YOU HAVE DISABILITY OR FAMILY LEAVE FORMS, BRING THEM TO THE OFFICE FOR PROCESSING.  DO NOT GIVE THEM TO YOUR DOCTOR.  11.  Return to work/school:  Desk work/light activities in 5-7 days, full duty/activities in 2 weeks if pain-free.   WHEN TO CALL us [phone]: 1. Poor pain control 2. Reactions / problems with new medications (rash/itching, nausea, etc)  3. Fever over 101.5 F (38.5 C) 4. Inability to urinate 5. Nausea and/or vomiting 6. Worsening swelling or bruising 7. Continued bleeding from incision. 8. Increased pain, redness, or drainage from the incision   The clinic staff is available to answer your questions during regular business hours (8:30am-5pm).  Please dont hesitate to call and ask to speak to one of our nurses for clinical concerns.   If you have a medical emergency, go to the nearest emergency room or call 911.  A surgeon from [facility] is always on call at the [facility], [place], [place], [place], [place]  [address] ? MAIN: [phone] ? TOLL FREE: [phone] ?  FAX [phone] [email or web address]

## 2014-12-01 NOTE — Anesthesia Preprocedure Evaluation (Addendum)
Anesthesia Evaluation  Patient identified by MRN, date of birth, ID band Patient awake    Reviewed: Allergy & Precautions, NPO status , Patient's Chart, lab work & pertinent test results  Airway Mallampati: II  TM Distance: >3 FB Neck ROM: Full    Dental  (+) Chipped, Dental Advisory Given   Pulmonary neg pulmonary ROS,    Pulmonary exam normal breath sounds clear to auscultation       Cardiovascular negative cardio ROS Normal cardiovascular exam Rhythm:Regular Rate:Normal     Neuro/Psych negative neurological ROS  negative psych ROS   GI/Hepatic negative GI ROS, Neg liver ROS,   Endo/Other  Morbid obesity  Renal/GU negative Renal ROS  negative genitourinary   Musculoskeletal negative musculoskeletal ROS (+)   Abdominal   Peds negative pediatric ROS (+)  Hematology negative hematology ROS (+)   Anesthesia Other Findings   Reproductive/Obstetrics negative OB ROS                            Anesthesia Physical Anesthesia Plan  ASA: II  Anesthesia Plan: General   Post-op Pain Management:    Induction: Intravenous  Airway Management Planned: Oral ETT  Additional Equipment:   Intra-op Plan:   Post-operative Plan: Extubation in OR  Informed Consent: I have reviewed the patients History and Physical, chart, labs and discussed the procedure including the risks, benefits and alternatives for the proposed anesthesia with the patient or authorized representative who has indicated his/her understanding and acceptance.   Dental advisory given  Plan Discussed with: CRNA and Surgeon  Anesthesia Plan Comments:         Anesthesia Quick Evaluation

## 2014-12-01 NOTE — Op Note (Signed)
OPERATIVE NOTE-LAPAROSCOPIC CHOLECYSTECTOMY  Preoperative diagnosis:  Symptomatic cholelithiasis  Postoperative diagnosis:  Same  Procedure: Laparoscopic cholecystectomy with cholangiogram.  Surgeon: Jackolyn Confer, M.D.  Asst.:  Judyann Munson, RNFA  Anesthesia: General  Indication:   This is a 39 year old female with symptomatic cholelithiasis who presents for elective cholecystectomy.  Technique: She was brought to the operating room, placed supine on the operating table, and a general anesthetic was administered. The hair on the abdominal wall was clipped as was necessary. The abdominal wall was then sterilely prepped and draped. Local anesthetic (Marcaine) was infiltrated in the subumbilical region. A small subumbilical incision was made through the skin, subcutaneous tissue, fascia, and peritoneum entering the peritoneal cavity under direct vision. A pursestring suture of 0 Vicryl was placed around the edges of the fascia. A Hassan trocar was introduced into the peritoneal cavity and a pneumoperitoneum was created by insufflation of carbon dioxide gas. The laparoscope was introduced into the trocar and no underlying bleeding or organ injury was noted. He/She was then placed in the reverse Trendelenburg position with the right side tilted slightly up.  Three 5 mm trocars were then placed into the abdominal cavity under laparoscopic vision. One in the epigastric area, and 2 in the right upper quadrant area. The gallbladder was visualized and the fundus was grasped and retracted toward the right shoulder.  The infundibulum was mobilized with dissection close to the gallbladder and retracted laterally.  During the dissection some bile leaked from the gallbladder from two small areas but no stones leaked out of the gallbladder.  The cystic duct was identified and a window was created around it. The anterior branch cystic artery was also identified and a window was created around it. The  critical view was achieved. A clip was placed at the neck of the gallbladder. A small incision was made in the cystic duct. A cholangiocatheter was introduced through the anterior abdominal wall and placed in the cystic duct. A intraoperative cholangiogram was then performed.  Under real-time fluoroscopy, dilute contrast was injected into the cystic duct.  The common hepatic duct, the right and left hepatic ducts, and the common duct were all visualized. Contrast drained into the duodenum without obvious evidence of any obstructing ductal lesion. The final report is pending the Radiologist's interpretation.  The cholangiocatheter was removed, the cystic duct was clipped 3 times on the biliary side, and then the cystic duct was divided sharply. No bile leak was noted from the cystic duct stump.  The anterior branch of the cystic artery was then clipped and divided. The posterior branch of the cystic artery was then isolated, clipped and divided. Following this the gallbladder was dissected free from the liver using electrocautery. The gallbladder was then placed in a retrieval bag and removed from the abdominal cavity through the subumbilical incision.  The gallbladder fossa was inspected, copiously irrigated, and bleeding was controlled with electrocautery. Inspection showed that hemostasis was adequate and there was no evidence of bile leak.  The irrigation fluid was evacuated as much as possible.  The subumbilical trocar was removed and the fascial defect was closed by tightening and tying down the pursestring suture under laparoscopic vision.  The remaining trocars were removed and the pneumoperitoneum was released. The skin incisions were closed with 4-0 Monocryl subcuticular stitches. Steri-Strips and sterile dressings were applied.  The procedure was well-tolerated without any apparent complications. She was taken to the recovery room in satisfactory condition.

## 2014-12-01 NOTE — Progress Notes (Signed)
Iv to right hand removed all intact

## 2014-12-02 ENCOUNTER — Encounter (HOSPITAL_COMMUNITY): Payer: Self-pay | Admitting: General Surgery

## 2015-02-26 ENCOUNTER — Encounter (HOSPITAL_BASED_OUTPATIENT_CLINIC_OR_DEPARTMENT_OTHER): Payer: Self-pay | Admitting: *Deleted

## 2015-02-26 ENCOUNTER — Emergency Department (HOSPITAL_BASED_OUTPATIENT_CLINIC_OR_DEPARTMENT_OTHER)
Admission: EM | Admit: 2015-02-26 | Discharge: 2015-02-26 | Disposition: A | Discharge status: Home / Self Care | Payer: Medicaid Other | Attending: Emergency Medicine | Admitting: Emergency Medicine

## 2015-02-26 DIAGNOSIS — Z79899 Other long term (current) drug therapy: Secondary | ICD-10-CM | POA: Diagnosis not present

## 2015-02-26 DIAGNOSIS — Z8679 Personal history of other diseases of the circulatory system: Secondary | ICD-10-CM | POA: Insufficient documentation

## 2015-02-26 DIAGNOSIS — H9203 Otalgia, bilateral: Secondary | ICD-10-CM | POA: Insufficient documentation

## 2015-02-26 DIAGNOSIS — J029 Acute pharyngitis, unspecified: Secondary | ICD-10-CM | POA: Insufficient documentation

## 2015-02-26 LAB — RAPID STREP SCREEN (MED CTR MEBANE ONLY): STREPTOCOCCUS, GROUP A SCREEN (DIRECT): NEGATIVE

## 2015-02-26 NOTE — Discharge Instructions (Signed)
Your symptoms are likely due to a virus. Your strep test was negative. You may continue using salt water gargles, Motrin for your discomfort. You may also try Cepacol drops that will help numb your throat. Follow-up with your doctor as needed. Return to ED for worsening symptoms.  Pharyngitis Pharyngitis is redness, pain, and swelling (inflammation) of your pharynx.  CAUSES  Pharyngitis is usually caused by infection. Most of the time, these infections are from viruses (viral) and are part of a cold. However, sometimes pharyngitis is caused by bacteria (bacterial). Pharyngitis can also be caused by allergies. Viral pharyngitis may be spread from person to person by coughing, sneezing, and personal items or utensils (cups, forks, spoons, toothbrushes). Bacterial pharyngitis may be spread from person to person by more intimate contact, such as kissing.  SIGNS AND SYMPTOMS  Symptoms of pharyngitis include:   Sore throat.   Tiredness (fatigue).   Low-grade fever.   Headache.  Joint pain and muscle aches.  Skin rashes.  Swollen lymph nodes.  Plaque-like film on throat or tonsils (often seen with bacterial pharyngitis). DIAGNOSIS  Your health care provider will ask you questions about your illness and your symptoms. Your medical history, along with a physical exam, is often all that is needed to diagnose pharyngitis. Sometimes, a rapid strep test is done. Other lab tests may also be done, depending on the suspected cause.  TREATMENT  Viral pharyngitis will usually get better in 3-4 days without the use of medicine. Bacterial pharyngitis is treated with medicines that kill germs (antibiotics).  HOME CARE INSTRUCTIONS   Drink enough water and fluids to keep your urine clear or pale yellow.   Only take over-the-counter or prescription medicines as directed by your health care provider:   If you are prescribed antibiotics, make sure you finish them even if you start to feel better.    Do not take aspirin.   Get lots of rest.   Gargle with 8 oz of salt water ( tsp of salt per 1 qt of water) as often as every 1-2 hours to soothe your throat.   Throat lozenges (if you are not at risk for choking) or sprays may be used to soothe your throat. SEEK MEDICAL CARE IF:   You have large, tender lumps in your neck.  You have a rash.  You cough up green, yellow-brown, or bloody spit. SEEK IMMEDIATE MEDICAL CARE IF:   Your neck becomes stiff.  You drool or are unable to swallow liquids.  You vomit or are unable to keep medicines or liquids down.  You have severe pain that does not go away with the use of recommended medicines.  You have trouble breathing (not caused by a stuffy nose). MAKE SURE YOU:   Understand these instructions.  Will watch your condition.  Will get help right away if you are not doing well or get worse.   This information is not intended to replace advice given to you by your health care provider. Make sure you discuss any questions you have with your health care provider.   Document Released: 12/19/2004 Document Revised: 10/09/2012 Document Reviewed: 08/26/2012 Elsevier Interactive Patient Education [employer].

## 2015-02-26 NOTE — ED Notes (Signed)
Sore throat and bilateral ear pain x 3 days.

## 2015-02-26 NOTE — ED Provider Notes (Signed)
CSN: [id number]     Arrival date & time 02/26/15  1824 History   First MD Initiated Contact with Patient 02/26/15 1859     Chief Complaint  Patient presents with  . Sore Throat     (Consider location/radiation/quality/duration/timing/severity/associated sxs/prior Treatment) HPI Laura Kramer is a 40 y.o. female who comes in for evaluation of sore throat. Patient reports symptoms have been ongoing for the past 3 days. She reports associated bilateral ear pain. She denies any fevers, chills, nausea or vomiting, difficulties opening her jaw. She does report her son had an earache last week. She also endorses a nonproductive cough as well as intermittent runny nose. She has tried Chloraseptic spray and Halls lozenges without full relief. No other aggravating or alleviating factors.  Past Medical History  Diagnosis Date  . Migraine   . Bronchitis     back in 09/2014, inhaler as needed   Past Surgical History  Procedure Laterality Date  . Cesarean section    . Tooth extraction  2008    1 tooth  . Cholecystectomy N/A 12/01/2014    Procedure: LAPAROSCOPIC CHOLECYSTECTOMY WITH INTRAOPERATIVE CHOLANGIOGRAM;  Surgeon: Jackolyn Confer, MD;  Location: [facility] OR;  Service: General;  Laterality: N/A;   Family History  Problem Relation Age of Onset  . Prostate cancer Father   . Diabetes Father   . Diabetes Mother   . Heart disease Father   . Heart disease Mother   . Hypertension Father   . Hypertension Mother    Social History  Substance Use Topics  . Smoking status: Never Smoker   . Smokeless tobacco: Never Used  . Alcohol Use: No   OB History    No data available     Review of Systems A 10 point review of systems was completed and was negative except for pertinent positives and negatives as mentioned in the history of present illness     Allergies  Ciprofloxacin and Clindamycin/lincomycin  Home Medications   Prior to Admission medications   Medication Sig Start Date End Date  Taking? Authorizing Provider  albuterol (PROVENTIL HFA;VENTOLIN HFA) 108 (90 BASE) MCG/ACT inhaler Inhale 2 puffs into the lungs every 6 (six) hours as needed. 09/21/14 09/21/15 Yes Historical Provider, MD  ferrous sulfate 325 (65 FE) MG tablet Take 325 mg by mouth daily with breakfast.   Yes Historical Provider, MD  frovatriptan (FROVA) 2.5 MG tablet Take 2.5 mg by mouth as needed for migraine.  05/15/14  Yes Historical Provider, MD  Linaclotide Rolan Lipa) 145 MCG CAPS capsule Take 1 capsule (145 mcg total) by mouth daily. Patient taking differently: Take 145 mcg by mouth every other day.  10/29/14  Yes Irene Shipper, MD  omeprazole (PRILOSEC) 20 MG capsule Take 1 capsule (20 mg total) by mouth daily. 07/03/14  Yes Lafayette Dragon, MD  ondansetron (ZOFRAN) 4 MG tablet Take 1 tablet (4 mg total) by mouth every 4 (four) hours as needed for nausea. 12/01/14  Yes Jackolyn Confer, MD  topiramate (TOPAMAX) 100 MG tablet Take 100 mg by mouth 2 (two) times daily.  05/21/14  Yes Historical Provider, MD  triamcinolone cream (KENALOG) 0.1 % Apply 1 application topically 2 (two) times daily as needed. 10/27/14  Yes Historical Provider, MD  ALPRAZolam Duanne Moron) 0.25 MG tablet Take 0.25 mg by mouth daily.  05/21/14   Historical Provider, MD  dicyclomine (BENTYL) 10 MG capsule Take 1 capsule (10 mg total) by mouth 2 (two) times daily before a meal. 10/28/14   Jenny Reichmann  Delice Lesch, MD  oxyCODONE (OXY IR/ROXICODONE) 5 MG immediate release tablet Take 1-2 tablets (5-10 mg total) by mouth every 4 (four) hours as needed for moderate pain, severe pain or breakthrough pain. 12/01/14   Jackolyn Confer, MD   BP 136/92 mmHg  Pulse 80  Temp(Src) 98.6 F (37 C) (Oral)  Resp 16  Ht 5\' 3"  (1.6 m)  Wt 104.327 kg  BMI 40.75 kg/m2  SpO2 100%  LMP 02/24/2015 Physical Exam  Constitutional:  Awake, alert, nontoxic appearance.  HENT:  Head: Atraumatic.  Mouth/Throat: Oropharynx is clear and moist. No oropharyngeal exudate.  Mildly  erythematous posterior oropharynx with no tonsillar swelling, uvular deviation or glossal elevation. No trismus  Eyes: Right eye exhibits no discharge. Left eye exhibits no discharge.  Neck: Normal range of motion. Neck supple.  Cardiovascular: Normal rate and regular rhythm.   Pulmonary/Chest: Effort normal. She exhibits no tenderness.  Abdominal: Soft. There is no tenderness. There is no rebound.  Musculoskeletal: She exhibits no tenderness.  Baseline ROM, no obvious new focal weakness.  Lymphadenopathy:    She has no cervical adenopathy.  Neurological:  Mental status and motor strength appears baseline for patient and situation.  Skin: No rash noted.  Psychiatric: She has a normal mood and affect.  Nursing note and vitals reviewed.   ED Course  Procedures (including critical care time) Labs Review Labs Reviewed  RAPID STREP SCREEN (NOT AT [facility])  CULTURE, GROUP A STREP [facility])    Imaging Review No results found. I have personally reviewed and evaluated these images and lab results as part of my medical decision-making.   EKG Interpretation None     Meds given in ED:  Medications - No data to display  Discharge Medication List as of 02/26/2015  7:31 PM     Filed Vitals:   02/26/15 1835  BP: 136/92  Pulse: 80  Temp: 98.6 F (37 C)  TempSrc: Oral  Resp: 16  Height: 5\' 3"  (1.6 m)  Weight: 104.327 kg  SpO2: 100%    MDM  Pt afebrile without tonsillar exudate, negative strep. Presents with mild cervical lymphadenopathy, & dysphagia; diagnosis of viral pharyngitis. No abx indicated. DC w symptomatic tx for pain  Pt does not appear dehydrated, but did discuss importance of water rehydration. Presentation non concerning for PTA or infxn spread to soft tissue. No trismus or uvula deviation. Specific return precautions discussed. Pt able to drink water in ED without difficulty with intact air way. Recommended PCP follow up.  Final diagnoses:  Pharyngitis         Comer Locket, PA-C 02/26/15 1948  Merrily Pew, MD 02/27/15 1259

## 2015-03-01 LAB — CULTURE, GROUP A STREP (THRC)

## 2015-03-03 ENCOUNTER — Ambulatory Visit: Payer: Self-pay | Admitting: Gastroenterology

## 2015-07-30 ENCOUNTER — Telehealth: Payer: Self-pay | Admitting: Gastroenterology

## 2015-07-30 ENCOUNTER — Other Ambulatory Visit: Payer: Self-pay

## 2015-07-30 MED ORDER — LINACLOTIDE 290 MCG PO CAPS: 290.0000 ug | ORAL_CAPSULE | Freq: Every day | ORAL | 5 refills | Status: DC

## 2015-07-30 NOTE — Telephone Encounter (Signed)
Patient is instructed and agrees with this plan of care. 

## 2015-07-30 NOTE — Telephone Encounter (Signed)
Patient is own Linzess 145 mcg. She doesn't like that she has never been able to stop taking it, but reports that of lately it has not worked as well. She takes it every morning, but doesn't have a daily bowel movement. She states she has a good intake of fiber and no sodas. She drinks a little water. What do you advise?

## 2015-07-30 NOTE — Telephone Encounter (Signed)
She may need long-term laxatives given she is now unable to have a bowel movement without the help of laxatives. She can increase the dose of linzess to 290 g daily. Increase dietary fiber and fluid intake

## 2016-01-03 HISTORY — PX: HERNIA REPAIR: SHX51

## 2016-02-02 ENCOUNTER — Ambulatory Visit: Payer: Self-pay | Admitting: Gastroenterology

## 2016-08-02 ENCOUNTER — Encounter (HOSPITAL_COMMUNITY): Payer: Self-pay | Admitting: *Deleted

## 2016-08-02 ENCOUNTER — Inpatient Hospital Stay (HOSPITAL_COMMUNITY): Payer: Medicaid Other

## 2016-08-02 ENCOUNTER — Inpatient Hospital Stay (HOSPITAL_COMMUNITY)
Admission: AD | Admit: 2016-08-02 | Discharge: 2016-08-03 | Disposition: A | Discharge status: Home / Self Care | Payer: Medicaid Other | Source: Ambulatory Visit | Attending: Obstetrics and Gynecology | Admitting: Obstetrics and Gynecology

## 2016-08-02 DIAGNOSIS — Z881 Allergy status to other antibiotic agents status: Secondary | ICD-10-CM | POA: Diagnosis not present

## 2016-08-02 DIAGNOSIS — Z3A01 Less than 8 weeks gestation of pregnancy: Secondary | ICD-10-CM | POA: Diagnosis not present

## 2016-08-02 DIAGNOSIS — O26899 Other specified pregnancy related conditions, unspecified trimester: Secondary | ICD-10-CM

## 2016-08-02 DIAGNOSIS — R1032 Left lower quadrant pain: Secondary | ICD-10-CM | POA: Diagnosis present

## 2016-08-02 DIAGNOSIS — Z6790 Unspecified blood type, Rh positive: Secondary | ICD-10-CM

## 2016-08-02 DIAGNOSIS — Z3491 Encounter for supervision of normal pregnancy, unspecified, first trimester: Secondary | ICD-10-CM

## 2016-08-02 DIAGNOSIS — R109 Unspecified abdominal pain: Secondary | ICD-10-CM

## 2016-08-02 DIAGNOSIS — O26891 Other specified pregnancy related conditions, first trimester: Secondary | ICD-10-CM | POA: Diagnosis not present

## 2016-08-02 LAB — CBC
HCT: 35 % — ABNORMAL LOW (ref 36.0–46.0)
HEMOGLOBIN: 11.9 g/dL — AB (ref 12.0–15.0)
MCH: 26.4 pg (ref 26.0–34.0)
MCHC: 34 g/dL (ref 30.0–36.0)
MCV: 77.8 fL — ABNORMAL LOW (ref 78.0–100.0)
PLATELETS: 410 10*3/uL — AB (ref 150–400)
RBC: 4.5 MIL/uL (ref 3.87–5.11)
RDW: 15.7 % — AB (ref 11.5–15.5)
WBC: 15.5 10*3/uL — ABNORMAL HIGH (ref 4.0–10.5)

## 2016-08-02 LAB — URINALYSIS, ROUTINE W REFLEX MICROSCOPIC
Bilirubin Urine: NEGATIVE
GLUCOSE, UA: NEGATIVE mg/dL
HGB URINE DIPSTICK: NEGATIVE
KETONES UR: NEGATIVE mg/dL
LEUKOCYTES UA: NEGATIVE
Nitrite: NEGATIVE
PH: 7 (ref 5.0–8.0)
Protein, ur: NEGATIVE mg/dL
Specific Gravity, Urine: 1.011 (ref 1.005–1.030)

## 2016-08-02 LAB — WET PREP, GENITAL
Sperm: NONE SEEN
Trich, Wet Prep: NONE SEEN
WBC WET PREP: NONE SEEN
YEAST WET PREP: NONE SEEN

## 2016-08-02 LAB — HCG, QUANTITATIVE, PREGNANCY: HCG, BETA CHAIN, QUANT, S: 27157 m[IU]/mL — AB (ref <5)

## 2016-08-02 LAB — POCT PREGNANCY, URINE: Preg Test, Ur: POSITIVE — AB

## 2016-08-02 MED ORDER — OXYCODONE-ACETAMINOPHEN 5-325 MG PO TABS
1.0000 | ORAL_TABLET | Freq: Once | ORAL | Status: AC
  Administered 2016-08-02: 1 via ORAL
  Filled 2016-08-02: qty 1

## 2016-08-02 NOTE — MAU Note (Signed)
Pt reports she had some pink spotting earlier today. It has stopped now but having lower abd cramping on her left side on and off all day.

## 2016-08-02 NOTE — Discharge Instructions (Signed)

## 2016-08-02 NOTE — MAU Provider Note (Signed)
History     CSN: [id number]  Arrival date and time: 08/02/16 2110   First Provider Initiated Contact with Patient 08/02/16 2208      Chief Complaint  Patient presents with  . Vaginal Bleeding  . Abdominal Pain   [id number] @[redacted]w[redacted]d  by sure LMP here with LLQ pain and spotting. Pain started around 10 am today. Describes as intermittent and crampy. Has not taken anything for it. No fevers. No urinary sx. Last BM yesterday. She noticed pink spotting today once. No recent IC.    OB History    Gravida Para Term Preterm AB Living   4 2 2   1 2    SAB TAB Ectopic Multiple Live Births   1       1      Past Medical History:  Diagnosis Date  . Bronchitis    back in 09/2014, inhaler as needed  . Migraine     Past Surgical History:  Procedure Laterality Date  . CESAREAN SECTION    . CHOLECYSTECTOMY N/A 12/01/2014   Procedure: LAPAROSCOPIC CHOLECYSTECTOMY WITH INTRAOPERATIVE CHOLANGIOGRAM;  Surgeon: Jackolyn Confer, MD;  Location: [place];  Service: General;  Laterality: N/A;  . HERNIA REPAIR    . TOOTH EXTRACTION  2008   1 tooth    Family History  Problem Relation Age of Onset  . Prostate cancer Father   . Diabetes Father   . Heart disease Father   . Hypertension Father   . Diabetes Mother   . Heart disease Mother   . Hypertension Mother     Social History  Substance Use Topics  . Smoking status: Never Smoker  . Smokeless tobacco: Never Used  . Alcohol use No    Allergies:  Allergies  Allergen Reactions  . Ciprofloxacin Rash  . Clindamycin/Lincomycin Rash    Prescriptions Prior to Admission  Medication Sig Dispense Refill Last Dose  . albuterol (PROVENTIL HFA;VENTOLIN HFA) 108 (90 BASE) MCG/ACT inhaler Inhale 2 puffs into the lungs every 6 (six) hours as needed.   Past Month at Unknown time  . ALPRAZolam (XANAX) 0.25 MG tablet Take 0.25 mg by mouth daily.    not taking  . dicyclomine (BENTYL) 10 MG capsule Take 1 capsule (10 mg total) by mouth 2 (two) times daily  before a meal. 60 capsule 2 11/30/2014 at Unknown time  . ferrous sulfate 325 (65 FE) MG tablet Take 325 mg by mouth daily with breakfast.   11/30/2014 at Unknown time  . frovatriptan (FROVA) 2.5 MG tablet Take 2.5 mg by mouth as needed for migraine.    More than a month at Unknown time  . linaclotide (LINZESS) 290 MCG CAPS capsule Take 1 capsule (290 mcg total) by mouth daily before breakfast. 30 capsule 5   . omeprazole (PRILOSEC) 20 MG capsule Take 1 capsule (20 mg total) by mouth daily. 30 capsule 3 Past Week at Unknown time  . ondansetron (ZOFRAN) 4 MG tablet Take 1 tablet (4 mg total) by mouth every 4 (four) hours as needed for nausea. 20 tablet 0   . oxyCODONE (OXY IR/ROXICODONE) 5 MG immediate release tablet Take 1-2 tablets (5-10 mg total) by mouth every 4 (four) hours as needed for moderate pain, severe pain or breakthrough pain. 30 tablet 0   . topiramate (TOPAMAX) 100 MG tablet Take 100 mg by mouth 2 (two) times daily.    Past Month at Unknown time  . triamcinolone cream (KENALOG) 0.1 % Apply 1 application topically 2 (two) times daily  as needed.  11 Past Week at Unknown time    Review of Systems  Constitutional: Negative for fever.  Gastrointestinal: Positive for abdominal pain. Negative for constipation.  Genitourinary: Positive for vaginal bleeding. Negative for dysuria and vaginal discharge.   Physical Exam   Blood pressure 127/86, pulse 96, temperature 98.1 F (36.7 C), resp. rate 18, height 5\' 3"  (1.6 m), weight 236 lb (107 kg), last menstrual period 06/11/2016.  Physical Exam  Nursing note and vitals reviewed. Constitutional: She is oriented to person, place, and time. She appears well-developed and well-nourished. No distress.  HENT:  Head: Normocephalic and atraumatic.  Neck: Normal range of motion.  Respiratory: Effort normal. No respiratory distress.  GI: Soft. She exhibits no distension and no mass. There is tenderness in the left lower quadrant. There is no  rebound and no guarding.  Genitourinary:  Genitourinary Comments: External: no lesions or erythema Vagina: rugated, pink, moist, scant brown discharge, no blood Uterus: non enlarged, anteverted, non tender, no CMT Adnexae: no masses, + tenderness left, no tenderness right   Musculoskeletal: Normal range of motion.  Neurological: She is alert and oriented to person, place, and time.  Skin: Skin is warm and dry.  Psychiatric: She has a normal mood and affect.   Results for orders placed or performed during the hospital encounter of 08/02/16 (from the past 24 hour(s))  Urinalysis, Routine w reflex microscopic     Status: Abnormal   Collection Time: 08/02/16  9:47 PM  Result Value Ref Range   Color, Urine STRAW (A) YELLOW   APPearance CLEAR CLEAR   Specific Gravity, Urine 1.011 1.005 - 1.030   pH 7.0 5.0 - 8.0   Glucose, UA NEGATIVE NEGATIVE mg/dL   Hgb urine dipstick NEGATIVE NEGATIVE   Bilirubin Urine NEGATIVE NEGATIVE   Ketones, ur NEGATIVE NEGATIVE mg/dL   Protein, ur NEGATIVE NEGATIVE mg/dL   Nitrite NEGATIVE NEGATIVE   Leukocytes, UA NEGATIVE NEGATIVE  Pregnancy, urine POC     Status: Abnormal   Collection Time: 08/02/16 10:00 PM  Result Value Ref Range   Preg Test, Ur POSITIVE (A) NEGATIVE  Wet prep, genital     Status: Abnormal   Collection Time: 08/02/16 10:15 PM  Result Value Ref Range   Yeast Wet Prep HPF POC NONE SEEN NONE SEEN   Trich, Wet Prep NONE SEEN NONE SEEN   Clue Cells Wet Prep HPF POC PRESENT (A) NONE SEEN   WBC, Wet Prep HPF POC NONE SEEN NONE SEEN   Sperm NONE SEEN   CBC     Status: Abnormal   Collection Time: 08/02/16 10:16 PM  Result Value Ref Range   WBC 15.5 (H) 4.0 - 10.5 K/uL   RBC 4.50 3.87 - 5.11 MIL/uL   Hemoglobin 11.9 (L) 12.0 - 15.0 g/dL   HCT 35.0 (L) 36.0 - 46.0 %   MCV 77.8 (L) 78.0 - 100.0 fL   MCH 26.4 26.0 - 34.0 pg   MCHC 34.0 30.0 - 36.0 g/dL   RDW 15.7 (H) 11.5 - 15.5 %   Platelets 410 (H) 150 - 400 K/uL  hCG, quantitative,  pregnancy     Status: Abnormal   Collection Time: 08/02/16 10:16 PM  Result Value Ref Range   hCG, Beta Chain, Quant, S 27,157 (H) <5 mIU/mL   US Ob Comp Less 14 Wks  Result Date: 08/02/2016 CLINICAL DATA:  Initial evaluation for spotting, early pregnancy. Beta HCG pending. EXAM: OBSTETRIC <14 WK [place] AND TRANSVAGINAL OB US TECHNIQUE:  Both transabdominal and transvaginal ultrasound examinations were performed for complete evaluation of the gestation as well as the maternal uterus, adnexal regions, and pelvic cul-de-sac. Transvaginal technique was performed to assess early pregnancy. COMPARISON:  Prior ultrasound from 02/05/2015. FINDINGS: Intrauterine gestational sac: Single Yolk sac:  Present Embryo:  Present Cardiac Activity: Present Heart Rate: 149  bpm CRL:  5.9  mm   6 w   2 d                  [place] EDC: 03/26/17 Subchorionic hemorrhage:  None visualized. Maternal uterus/adnexae: Left ovary within normal limits. 1.8 x 1.7 x 1.9 cm simple anechoic cyst noted within the right ovary, most consistent with a normal physiologic cyst. No adnexal mass. Two uterine fibroids noted, 1 posterior and intramural location measures 1.6 x 1.5 x 1.6 cm. Second subserosal and located on the left and anteriorly measures 2.0 x 2.1 x 1.7 cm No free fluid. IMPRESSION: 1. Single viable intrauterine pregnancy as above without complication. Estimated gestational age [redacted] weeks and 2 days by sonography. 2. Fibroid uterus as above. 3. No other acute maternal or uterine or adnexal abnormality identified. Electronically Signed   By: Jeannine Boga M.D.   On: 08/02/2016 23:07   US Ob Transvaginal  Result Date: 08/02/2016 CLINICAL DATA:  Initial evaluation for spotting, early pregnancy. Beta HCG pending. EXAM: OBSTETRIC <14 WK [place] AND TRANSVAGINAL OB US TECHNIQUE: Both transabdominal and transvaginal ultrasound examinations were performed for complete evaluation of the gestation as well as the maternal uterus, adnexal regions, and pelvic  cul-de-sac. Transvaginal technique was performed to assess early pregnancy. COMPARISON:  Prior ultrasound from 02/05/2015. FINDINGS: Intrauterine gestational sac: Single Yolk sac:  Present Embryo:  Present Cardiac Activity: Present Heart Rate: 149  bpm CRL:  5.9  mm   6 w   2 d                  [place] EDC: 03/26/17 Subchorionic hemorrhage:  None visualized. Maternal uterus/adnexae: Left ovary within normal limits. 1.8 x 1.7 x 1.9 cm simple anechoic cyst noted within the right ovary, most consistent with a normal physiologic cyst. No adnexal mass. Two uterine fibroids noted, 1 posterior and intramural location measures 1.6 x 1.5 x 1.6 cm. Second subserosal and located on the left and anteriorly measures 2.0 x 2.1 x 1.7 cm No free fluid. IMPRESSION: 1. Single viable intrauterine pregnancy as above without complication. Estimated gestational age [redacted] weeks and 2 days by sonography. 2. Fibroid uterus as above. 3. No other acute maternal or uterine or adnexal abnormality identified. Electronically Signed   By: Jeannine Boga M.D.   On: 08/02/2016 23:07   MAU Course  Procedures Percocet 5 mg po x1  MDM Labs and [place] ordered and reviewed. Pain improved. No evidence of acute abdominal or pelvic process. Normal IUP on [place].Pain could be from round ligament. Presentation, clinical findings, and plan discussed with Dr. Melba Coon. Stable for discharge home.   Assessment and Plan   1. [redacted] weeks gestation of pregnancy   2. Abdominal pain in pregnancy   3. Normal intrauterine pregnancy on prenatal ultrasound in first trimester   4. Blood type, Rh positive    Discharge home Follow up in OB office as scheduled Bleeding/return precautions Tylenol prn Heating pad on low prn  Allergies as of 08/03/2016      Reactions   Ciprofloxacin Rash   Clindamycin/lincomycin Rash      Medication List    STOP taking  these medications   ALPRAZolam 0.25 MG tablet Commonly known as:  XANAX   dicyclomine 10 MG capsule Commonly  known as:  BENTYL   ferrous sulfate 325 (65 FE) MG tablet   FROVA 2.5 MG tablet Generic drug:  frovatriptan   linaclotide 290 MCG Caps capsule Commonly known as:  LINZESS   ondansetron 4 MG tablet Commonly known as:  ZOFRAN   oxyCODONE 5 MG immediate release tablet Commonly known as:  Oxy IR/ROXICODONE   topiramate 100 MG tablet Commonly known as:  TOPAMAX   triamcinolone cream 0.1 % Commonly known as:  KENALOG     TAKE these medications   albuterol 108 (90 Base) MCG/ACT inhaler Commonly known as:  PROVENTIL HFA;VENTOLIN HFA Inhale 2 puffs into the lungs every 6 (six) hours as needed.   omeprazole 20 MG capsule Commonly known as:  PRILOSEC Take 1 capsule (20 mg total) by mouth daily.      Julianne Handler , CNM 08/02/2016, 10:18 PM

## 2016-08-03 DIAGNOSIS — R109 Unspecified abdominal pain: Secondary | ICD-10-CM | POA: Diagnosis not present

## 2016-08-03 DIAGNOSIS — Z3A01 Less than 8 weeks gestation of pregnancy: Secondary | ICD-10-CM | POA: Diagnosis not present

## 2016-08-03 DIAGNOSIS — O26891 Other specified pregnancy related conditions, first trimester: Secondary | ICD-10-CM | POA: Diagnosis not present

## 2016-08-04 LAB — GC/CHLAMYDIA PROBE AMP (~~LOC~~) NOT AT ARMC
Chlamydia: NEGATIVE
Neisseria Gonorrhea: NEGATIVE

## 2016-08-06 ENCOUNTER — Encounter (HOSPITAL_BASED_OUTPATIENT_CLINIC_OR_DEPARTMENT_OTHER): Payer: Self-pay | Admitting: *Deleted

## 2016-08-06 ENCOUNTER — Emergency Department (HOSPITAL_BASED_OUTPATIENT_CLINIC_OR_DEPARTMENT_OTHER)
Admission: EM | Admit: 2016-08-06 | Discharge: 2016-08-06 | Disposition: A | Discharge status: Home / Self Care | Payer: Medicaid Other | Attending: Emergency Medicine | Admitting: Emergency Medicine

## 2016-08-06 DIAGNOSIS — L02211 Cutaneous abscess of abdominal wall: Secondary | ICD-10-CM | POA: Diagnosis not present

## 2016-08-06 DIAGNOSIS — Z3A08 8 weeks gestation of pregnancy: Secondary | ICD-10-CM | POA: Insufficient documentation

## 2016-08-06 DIAGNOSIS — R1902 Left upper quadrant abdominal swelling, mass and lump: Secondary | ICD-10-CM | POA: Diagnosis present

## 2016-08-06 DIAGNOSIS — L0291 Cutaneous abscess, unspecified: Secondary | ICD-10-CM

## 2016-08-06 DIAGNOSIS — O9989 Other specified diseases and conditions complicating pregnancy, childbirth and the puerperium: Secondary | ICD-10-CM | POA: Insufficient documentation

## 2016-08-06 MED ORDER — AMOXICILLIN 500 MG PO CAPS: 500.0000 mg | ORAL_CAPSULE | Freq: Three times a day (TID) | ORAL | 0 refills | Status: DC

## 2016-08-06 MED ORDER — LIDOCAINE HCL (PF) 1 % IJ SOLN: 5.0000 mL | Freq: Once | INTRAMUSCULAR | Status: DC

## 2016-08-06 MED ORDER — LIDOCAINE HCL 1 % IJ SOLN
INTRAMUSCULAR | Status: AC
  Administered 2016-08-06: 12:00:00
  Filled 2016-08-06: qty 20

## 2016-08-06 NOTE — ED Provider Notes (Signed)
[facility] Provider Note   CSN: [id number] Arrival date & time: 08/06/16  3790     History   Chief Complaint Chief Complaint  Patient presents with  . Mass    abdominal, [redacted] weeks pregnant  . Abscess    HPI Laura Kramer is a 41 y.o. female.  Patient is a 41 year old female presenting with complaints of pain and swelling to the left upper abdominal wall. This started several days ago and is worsening. She denies any fevers or chills. She denies any insect bite. She had a similar area in the past that required antibiotics and incision and drainage.      Past Medical History:  Diagnosis Date  . Bronchitis    back in 09/2014, inhaler as needed  . Migraine     Patient Active Problem List   Diagnosis Date Noted  . Gallstones 10/27/2014    Past Surgical History:  Procedure Laterality Date  . CESAREAN SECTION    . CHOLECYSTECTOMY N/A 12/01/2014   Procedure: LAPAROSCOPIC CHOLECYSTECTOMY WITH INTRAOPERATIVE CHOLANGIOGRAM;  Surgeon: Jackolyn Confer, MD;  Location: [facility];  Service: General;  Laterality: N/A;  . HERNIA REPAIR    . TOOTH EXTRACTION  2008   1 tooth    OB History    Gravida Para Term Preterm AB Living   4 2 2   1 2    SAB TAB Ectopic Multiple Live Births   1       1       Home Medications    Prior to Admission medications   Medication Sig Start Date End Date Taking? Authorizing Provider  Prenatal MV-Min-Fe Fum-FA-DHA (PRENATAL 1 PO) Take by mouth.   Yes [provider]  albuterol (PROVENTIL HFA;VENTOLIN HFA) 108 (90 BASE) MCG/ACT inhaler Inhale 2 puffs into the lungs every 6 (six) hours as needed. 09/21/14 09/21/15  [provider]  omeprazole (PRILOSEC) 20 MG capsule Take 1 capsule (20 mg total) by mouth daily. 07/03/14   Lafayette Dragon, MD    Family History Family History  Problem Relation Age of Onset  . Prostate cancer Father   . Diabetes Father   . Heart disease Father   . Hypertension Father   . Diabetes  Mother   . Heart disease Mother   . Hypertension Mother     Social History Social History  Substance Use Topics  . Smoking status: Never Smoker  . Smokeless tobacco: Never Used  . Alcohol use No     Allergies   Ciprofloxacin and Clindamycin/lincomycin   Review of Systems Review of Systems  All other systems reviewed and are negative.    Physical Exam Updated Vital Signs BP (!) 124/96 (BP Location: Left Arm)   Pulse 84   Temp 98.1 F (36.7 C)   Ht 5\' 3"  (1.6 m)   Wt 105.2 kg (232 lb)   LMP 06/11/2016   SpO2 100%   BMI 41.10 kg/m   Physical Exam  Constitutional: She is oriented to person, place, and time. She appears well-developed and well-nourished. No distress.  HENT:  Head: Normocephalic and atraumatic.  Neck: Normal range of motion. Neck supple.  Neurological: She is alert and oriented to person, place, and time.  Skin: Skin is warm and dry. She is not diaphoretic.  There is a 4 cm round, indurated area to the left upper abdominal wall just under the left breast. It is erythematous and warm to the touch.  Nursing note and vitals reviewed.    ED  Treatments / Results  Labs (all labs ordered are listed, but only abnormal results are displayed) Labs Reviewed - No data to display  EKG  EKG Interpretation None       Radiology No results found.  Procedures Procedures (including critical care time)  Medications Ordered in ED Medications  lidocaine (PF) (XYLOCAINE) 1 % injection 5 mL (not administered)  lidocaine (XYLOCAINE) 1 % (with pres) injection (not administered)     Initial Impression / Assessment and Plan / ED Course  I have reviewed the triage vital signs and the nursing notes.  Pertinent labs & imaging results that were available during my care of the patient were reviewed by me and considered in my medical decision making (see chart for details).  INCISION AND DRAINAGE Performed by: Veryl Speak Consent: Verbal consent  obtained. Risks and benefits: risks, benefits and alternatives were discussed Type: abscess  Body area: Abdominal wall  Anesthesia: local infiltration  Incision was made with a scalpel.  Local anesthetic: lidocaine 1 % without epinephrine  Anesthetic total: 2 ml  Complexity: complex Blunt dissection to break up loculations  Drainage: purulent  Drainage amount: Minimal   Packing material: No packing placed   Patient tolerance: Patient tolerated the procedure well with no immediate complications.   I&D resulted in only minimal purulent material. I will advise warm soaks as frequently as possible, local wound care, and antibiotics. To return as needed for any problems.  Final Clinical Impressions(s) / ED Diagnoses   Final diagnoses:  None    New Prescriptions New Prescriptions   No medications on file     Veryl Speak, MD 08/06/16 1131

## 2016-08-06 NOTE — Discharge Instructions (Signed)
Amoxicillin as prescribed.  Apply warm soaks as frequently as possible for the next several days.  Return to the emergency department for increased redness, high fevers, or other new and concerning symptoms.

## 2016-08-06 NOTE — ED Triage Notes (Signed)
Patient states she has a abscess on the left upper abdomen for several weeks, which is getting bigger.  Now is approximately 4 cm by 4 cm, red, warm and firm to touch.

## 2016-08-15 ENCOUNTER — Inpatient Hospital Stay (HOSPITAL_COMMUNITY)
Admission: AD | Admit: 2016-08-15 | Discharge: 2016-08-15 | Discharge status: Against Medical Advice | Payer: Medicaid Other | Source: Ambulatory Visit | Attending: Obstetrics and Gynecology | Admitting: Obstetrics and Gynecology

## 2016-08-15 NOTE — Progress Notes (Signed)
Called for patient a second time and family member went out to MAU parking area and come back in and said pt is talking with the police.

## 2016-08-15 NOTE — Progress Notes (Signed)
Called pt, not in lobby.  

## 2016-08-15 NOTE — Progress Notes (Signed)
Called for pt, not in lobby, pt in MAU parking area per family member and "is coming"

## 2016-08-15 NOTE — Progress Notes (Signed)
Called for pt a third time, not in lobby, family members no longer in lobby either, Security checked parking lot and pt and family members left.

## 2016-08-16 LAB — OB RESULTS CONSOLE HIV ANTIBODY (ROUTINE TESTING): HIV: NONREACTIVE

## 2016-08-16 LAB — OB RESULTS CONSOLE HEPATITIS B SURFACE ANTIGEN: HEP B S AG: NEGATIVE

## 2016-08-16 LAB — OB RESULTS CONSOLE RPR: RPR: NONREACTIVE

## 2016-08-16 LAB — OB RESULTS CONSOLE RUBELLA ANTIBODY, IGM: Rubella: IMMUNE

## 2016-08-17 LAB — OB RESULTS CONSOLE GC/CHLAMYDIA: Gonorrhea: NEGATIVE

## 2016-09-26 ENCOUNTER — Encounter (HOSPITAL_COMMUNITY): Payer: Self-pay | Admitting: *Deleted

## 2016-09-26 ENCOUNTER — Inpatient Hospital Stay (HOSPITAL_COMMUNITY)
Admission: AD | Admit: 2016-09-26 | Discharge: 2016-09-26 | Disposition: A | Discharge status: Home / Self Care | Payer: Medicaid Other | Source: Ambulatory Visit | Attending: Obstetrics and Gynecology | Admitting: Obstetrics and Gynecology

## 2016-09-26 DIAGNOSIS — O26892 Other specified pregnancy related conditions, second trimester: Secondary | ICD-10-CM | POA: Insufficient documentation

## 2016-09-26 DIAGNOSIS — N898 Other specified noninflammatory disorders of vagina: Secondary | ICD-10-CM | POA: Insufficient documentation

## 2016-09-26 DIAGNOSIS — O219 Vomiting of pregnancy, unspecified: Secondary | ICD-10-CM | POA: Insufficient documentation

## 2016-09-26 DIAGNOSIS — R109 Unspecified abdominal pain: Secondary | ICD-10-CM | POA: Insufficient documentation

## 2016-09-26 DIAGNOSIS — Z881 Allergy status to other antibiotic agents status: Secondary | ICD-10-CM | POA: Diagnosis not present

## 2016-09-26 DIAGNOSIS — Z8249 Family history of ischemic heart disease and other diseases of the circulatory system: Secondary | ICD-10-CM | POA: Diagnosis not present

## 2016-09-26 DIAGNOSIS — O34219 Maternal care for unspecified type scar from previous cesarean delivery: Secondary | ICD-10-CM | POA: Insufficient documentation

## 2016-09-26 DIAGNOSIS — Z3A14 14 weeks gestation of pregnancy: Secondary | ICD-10-CM | POA: Diagnosis not present

## 2016-09-26 DIAGNOSIS — Z9049 Acquired absence of other specified parts of digestive tract: Secondary | ICD-10-CM | POA: Insufficient documentation

## 2016-09-26 DIAGNOSIS — Z833 Family history of diabetes mellitus: Secondary | ICD-10-CM | POA: Diagnosis not present

## 2016-09-26 DIAGNOSIS — Z8042 Family history of malignant neoplasm of prostate: Secondary | ICD-10-CM | POA: Insufficient documentation

## 2016-09-26 DIAGNOSIS — Z79899 Other long term (current) drug therapy: Secondary | ICD-10-CM | POA: Diagnosis not present

## 2016-09-26 LAB — URINALYSIS, ROUTINE W REFLEX MICROSCOPIC
BILIRUBIN URINE: NEGATIVE
Glucose, UA: NEGATIVE mg/dL
Hgb urine dipstick: NEGATIVE
Ketones, ur: 5 mg/dL — AB
Leukocytes, UA: NEGATIVE
Nitrite: NEGATIVE
Protein, ur: NEGATIVE mg/dL
SPECIFIC GRAVITY, URINE: 1.011 (ref 1.005–1.030)
pH: 5 (ref 5.0–8.0)

## 2016-09-26 LAB — WET PREP, GENITAL
Clue Cells Wet Prep HPF POC: NONE SEEN
SPERM: NONE SEEN
TRICH WET PREP: NONE SEEN
YEAST WET PREP: NONE SEEN

## 2016-09-26 MED ORDER — PROMETHAZINE HCL 25 MG/ML IJ SOLN
25.0000 mg | Freq: Once | INTRAMUSCULAR | Status: AC
  Administered 2016-09-26: 25 mg via INTRAVENOUS
  Filled 2016-09-26: qty 1

## 2016-09-26 MED ORDER — SODIUM CHLORIDE 0.9 % IV BOLUS (SEPSIS)
500.0000 mL | Freq: Once | INTRAVENOUS | Status: AC
  Administered 2016-09-26: 500 mL via INTRAVENOUS

## 2016-09-26 NOTE — Discharge Instructions (Signed)

## 2016-09-26 NOTE — MAU Provider Note (Signed)
History     CSN: [id number]  Arrival date and time: 09/26/16 1651   None     Chief Complaint  Patient presents with  . Abdominal Pain  . Emesis  . Nausea   41 yo [id number] at [redacted]w[redacted]d gestation presents  With nausea and vomiting x 1 day. Denies diarrhea or fever. Admits to decreased appetite and lower abdominal pressure. Currently on diclegis but feels it is not working well. No known sick contacts. Denies vaginal bleeding. Admits to white vaginal discharge that started today. Discharge is white and thick and non-odorous.    OB History    Gravida Para Term Preterm AB Living   4 2 2   1 2    SAB TAB Ectopic Multiple Live Births   1       1      Past Medical History:  Diagnosis Date  . Bronchitis    back in 09/2014, inhaler as needed  . Migraine     Past Surgical History:  Procedure Laterality Date  . CESAREAN SECTION    . CHOLECYSTECTOMY N/A 12/01/2014   Procedure: LAPAROSCOPIC CHOLECYSTECTOMY WITH INTRAOPERATIVE CHOLANGIOGRAM;  Surgeon: Jackolyn Confer, MD;  Location: [facility];  Service: General;  Laterality: N/A;  . HERNIA REPAIR    . TOOTH EXTRACTION  2008   1 tooth    Family History  Problem Relation Age of Onset  . Prostate cancer Father   . Diabetes Father   . Heart disease Father   . Hypertension Father   . Diabetes Mother   . Heart disease Mother   . Hypertension Mother     Social History  Substance Use Topics  . Smoking status: Never Smoker  . Smokeless tobacco: Never Used  . Alcohol use No    Allergies:  Allergies  Allergen Reactions  . Ciprofloxacin Rash  . Clindamycin/Lincomycin Rash    Prescriptions Prior to Admission  Medication Sig Dispense Refill Last Dose  . albuterol (PROVENTIL HFA;VENTOLIN HFA) 108 (90 BASE) MCG/ACT inhaler Inhale 2 puffs into the lungs every 6 (six) hours as needed.   Past Month at Unknown time  . amoxicillin (AMOXIL) 500 MG capsule Take 1 capsule (500 mg total) by mouth 3 (three) times daily. 21 capsule 0   .  omeprazole (PRILOSEC) 20 MG capsule Take 1 capsule (20 mg total) by mouth daily. 30 capsule 3 Past Week at Unknown time  . Prenatal MV-Min-Fe Fum-FA-DHA (PRENATAL 1 PO) Take by mouth.       Review of Systems  Constitutional: Negative for chills, fatigue and fever.  HENT: Negative for congestion and ear pain.   Respiratory: Negative for cough and shortness of breath.   Cardiovascular: Negative for chest pain and palpitations.  Gastrointestinal: Positive for abdominal pain, nausea and vomiting. Negative for abdominal distention and diarrhea.  Genitourinary: Negative for dysuria, flank pain and urgency.  Neurological: Negative for dizziness and light-headedness.   Physical Exam   Blood pressure 129/88, pulse 90, temperature 98.1 F (36.7 C), temperature source Oral, resp. rate 18, weight 242 lb 0.6 oz (109.8 kg), last menstrual period 06/11/2016, SpO2 99 %.  Physical Exam  Constitutional: She is oriented to person, place, and time. She appears well-developed and well-nourished. No distress.  HENT:  Head: Normocephalic and atraumatic.  Eyes: Pupils are equal, round, and reactive to light. EOM are normal.  Neck: Normal range of motion. Neck supple.  Cardiovascular: Normal rate, regular rhythm and normal heart sounds.   Respiratory: Effort normal and breath sounds normal. No  respiratory distress. She has no wheezes.  GI: Soft. Bowel sounds are normal. There is no tenderness. There is no rebound and no guarding.  Musculoskeletal: Normal range of motion. She exhibits no edema.  Neurological: She is alert and oriented to person, place, and time.  Skin: Skin is warm and dry.  Psychiatric: She has a normal mood and affect. Her behavior is normal.    MAU Course  Procedures  MDM UA does not show dehydration but will try IVF bolus and phenergan for symptomatic relief. Will get gc/chlamydia and wet prep since patient endorses vaginal discharge.  Evaluated patient at bedside. She received  phenergan and says she still feels nauseated. However, she was eating a meal from [employer]. Wet prep neg. Will call with gc/chlamydia if positive.   Assessment and Plan  1. Nausea and vomiting in pregnancy- continue diclegis and given dietary advice for improving nausea. 2. Pregnancy at 14 weeks- continue current prenatal care  [employer] 09/26/2016, 6:22 PM

## 2016-09-26 NOTE — MAU Note (Addendum)
States sent by OB N/V x 2days; states unable to eat or drink anything-->10 times in past 24 hours; on diclegis ?dehydration +lower abdominal pain--cramping--rating pain 8/10

## 2016-09-27 LAB — GC/CHLAMYDIA PROBE AMP (~~LOC~~) NOT AT ARMC
CHLAMYDIA, DNA PROBE: NEGATIVE
Neisseria Gonorrhea: NEGATIVE

## 2016-12-03 ENCOUNTER — Other Ambulatory Visit: Payer: Self-pay

## 2016-12-03 ENCOUNTER — Emergency Department (HOSPITAL_BASED_OUTPATIENT_CLINIC_OR_DEPARTMENT_OTHER)
Admission: EM | Admit: 2016-12-03 | Discharge: 2016-12-03 | Disposition: A | Discharge status: Home / Self Care | Payer: Medicaid Other | Attending: Emergency Medicine | Admitting: Emergency Medicine

## 2016-12-03 ENCOUNTER — Encounter (HOSPITAL_BASED_OUTPATIENT_CLINIC_OR_DEPARTMENT_OTHER): Payer: Self-pay | Admitting: Emergency Medicine

## 2016-12-03 DIAGNOSIS — Z7984 Long term (current) use of oral hypoglycemic drugs: Secondary | ICD-10-CM | POA: Insufficient documentation

## 2016-12-03 DIAGNOSIS — Z79899 Other long term (current) drug therapy: Secondary | ICD-10-CM | POA: Insufficient documentation

## 2016-12-03 DIAGNOSIS — M7989 Other specified soft tissue disorders: Secondary | ICD-10-CM | POA: Diagnosis not present

## 2016-12-03 DIAGNOSIS — L0211 Cutaneous abscess of neck: Secondary | ICD-10-CM | POA: Diagnosis present

## 2016-12-03 MED ORDER — CEPHALEXIN 500 MG PO CAPS: 500.0000 mg | ORAL_CAPSULE | Freq: Four times a day (QID) | ORAL | 0 refills | Status: DC

## 2016-12-03 NOTE — ED Triage Notes (Signed)
Patient reports that she has had a abscess to the back of her neck x 2 days

## 2016-12-03 NOTE — ED Provider Notes (Signed)
[facility] EMERGENCY DEPARTMENT Provider Note   CSN: [id number] Arrival date & time: 12/03/16  1336     History   Chief Complaint Chief Complaint  Patient presents with  . Abscess    HPI Laura Kramer is a 41 y.o. female.  HPI   Patient is a 55-year female with a history of diabetes mellitus and is currently [redacted] weeks pregnant presenting for a swelling on the posterior neck.  Patient reports she has noticed this for approximately 2-3 days.  She feels that it is tender.  Patient has previously had a sebaceous cyst in this area, however she reports it is not removed.  Patient was briefly treated on antibiotics and it decreased.  Patient has not had any fever or chills.  No nausea or vomiting.  No changes in sensation of the upper extremities distal to the site.  Patient reports she was previously prescribed amoxicillin.  Past Medical History:  Diagnosis Date  . Bronchitis    back in 09/2014, inhaler as needed  . Migraine     Patient Active Problem List   Diagnosis Date Noted  . Gallstones 10/27/2014    Past Surgical History:  Procedure Laterality Date  . CESAREAN SECTION    . CHOLECYSTECTOMY N/A 12/01/2014   Procedure: LAPAROSCOPIC CHOLECYSTECTOMY WITH INTRAOPERATIVE CHOLANGIOGRAM;  Surgeon: Jackolyn Confer, MD;  Location: [facility];  Service: General;  Laterality: N/A;  . HERNIA REPAIR    . TOOTH EXTRACTION  2008   1 tooth    OB History    Gravida Para Term Preterm AB Living   4 2 2   1 2    SAB TAB Ectopic Multiple Live Births   1       1       Home Medications    Prior to Admission medications   Medication Sig Start Date End Date Taking? Authorizing Provider  acetaminophen (TYLENOL) 500 MG tablet Take 1,000 mg by mouth every 6 (six) hours as needed for moderate pain.    [provider]  albuterol (PROVENTIL HFA;VENTOLIN HFA) 108 (90 BASE) MCG/ACT inhaler Inhale 2 puffs into the lungs every 6 (six) hours as needed. 09/21/14 09/21/15   [provider]  amoxicillin (AMOXIL) 500 MG capsule Take 1 capsule (500 mg total) by mouth 3 (three) times daily. Patient not taking: Reported on 09/26/2016 08/06/16   Veryl Speak, MD  cephALEXin (KEFLEX) 500 MG capsule Take 1 capsule (500 mg total) by mouth 4 (four) times daily. 12/03/16   Langston Masker B, PA-C  DICLEGIS 10-10 MG TBEC Take 20 mg by mouth 2 (two) times daily. 09/13/16   [provider]  metFORMIN (GLUCOPHAGE) 500 MG tablet Take 500 mg by mouth 2 (two) times daily. 06/28/16 06/28/17  [provider]  omeprazole (PRILOSEC) 20 MG capsule Take 1 capsule (20 mg total) by mouth daily. 07/03/14   Lafayette Dragon, MD  Prenatal MV-Min-Fe Fum-FA-DHA (PRENATAL 1 PO) Take 1 tablet by mouth daily.     [provider]  ranitidine (ZANTAC) 150 MG tablet Take 150 mg by mouth 3 times/day as needed-between meals & bedtime for heartburn.    [provider]    Family History Family History  Problem Relation Age of Onset  . Prostate cancer Father   . Diabetes Father   . Heart disease Father   . Hypertension Father   . Diabetes Mother   . Heart disease Mother   . Hypertension Mother     Social History Social History  Tobacco Use  . Smoking status: Never Smoker  . Smokeless tobacco: Never Used  Substance Use Topics  . Alcohol use: No  . Drug use: No     Allergies   Ciprofloxacin and Clindamycin/lincomycin   Review of Systems Review of Systems  Constitutional: Negative for chills and fever.  Musculoskeletal: Negative for neck stiffness.  Skin: Negative for color change.       + lump  Neurological: Negative for numbness.     Physical Exam Updated Vital Signs BP (!) 111/45 (BP Location: Right Arm)   Pulse 92   Temp 98.2 F (36.8 C) (Oral)   Resp 18   Ht 5\' 8"  (1.727 m)   Wt 113.4 kg (250 lb)   LMP 06/11/2016   SpO2 100%   BMI 38.01 kg/m   Physical Exam  Constitutional: She appears well-developed and well-nourished. No  distress.  Sitting comfortably in bed.  HENT:  Head: Normocephalic and atraumatic.  Eyes: Conjunctivae are normal. Right eye exhibits no discharge. Left eye exhibits no discharge.  EOMs normal to gross examination.  Neck: Normal range of motion.  Cardiovascular: Normal rate and regular rhythm.  Intact, 2+ radial pulse.  Pulmonary/Chest:  Normal respiratory effort. Patient converses comfortably. No audible wheeze or stridor.  Abdominal: She exhibits no distension.  Musculoskeletal: Normal range of motion.  Neurological: She is alert.  Cranial nerves intact to gross observation. Patient moves extremities without difficulty.  Skin: Skin is warm and dry. She is not diaphoretic.  There is approximately 2 cm raised firm cystic structure of the posterior neck on the right side.  It is not fluctuant.  There is no surrounding erythema.  Psychiatric: She has a normal mood and affect. Her behavior is normal. Judgment and thought content normal.  Nursing note and vitals reviewed.    ED Treatments / Results  Labs (all labs ordered are listed, but only abnormal results are displayed) Labs Reviewed - No data to display  EKG  EKG Interpretation None       Radiology No results found.  Procedures Procedures (including critical care time)  EMERGENCY DEPARTMENT US SOFT TISSUE INTERPRETATION "Study: Limited Soft Tissue Ultrasound"  INDICATIONS: Cystic structure of the neck Multiple views of the body part were obtained in real-time with a multi-frequency linear probe PERFORMED BY:  Myself IMAGES ARCHIVED?: No SIDE:Left BODY PART:Neck FINDINGS: Other Fluid collection present. INTERPRETATION:  Fluid collection in a firm cystsic structure, c/w sebaceous cyst.   CPT: Neck [id number]  Upper extremity 95188-41  Axilla 66063-01  Chest wall 60109-32  Beast 35573-22  Upper back 02542-70  Lower back 62376-28  Abdominal wall 31517-61  Pelvic wall 60737-10  Lower extremity  62694-85  Other soft tissue 46270-35 Medications Ordered in ED Medications - No data to display   Initial Impression / Assessment and Plan / ED Course  I have reviewed the triage vital signs and the nursing notes.  Pertinent labs & imaging results that were available during my care of the patient were reviewed by me and considered in my medical decision making (see chart for details).      Final Clinical Impressions(s) / ED Diagnoses   Final diagnoses:  Cyst of soft tissue   The patient is nontoxic-appearing and in no acute distress.  Patient exhibits a cystic structure with a homogenous fluid-filled collection identified on ultrasound.  This is consistent with a sebaceous cyst.  There does not appear to be any overlying erythema or evidence of infection at this time.  The  area is tender.  This area was looked at and discussed with Dr. Davonna Belling.  Given the homogenous nature, I&D was suggested to patient.  Patient does not wish for I&D at this time.  Patient would like trial of antibiotics that she has previously had.  I discussed the risks and benefits of watchful waiting with antibiotics versus I&D performed today.  I discussed with patient that I would like her to follow-up with her primary care provider in 48 hours to check this area and determine if it is more erythematous, edematous, or tender, indicating that I&D may be warranted at this time.  Return precautions given for any worsening erythema, pain, fever.  Keflex prescribed, as it is safe in pregnancy.  Patient has previously tolerated amoxicillin.  Patient and her family are in understanding and agree with the plan of care.  This is a shared visit with Dr. Davonna Belling. Patient was independently evaluated by this attending physician. Attending physician consulted in evaluation and discharge management.  ED Discharge Orders        Ordered    cephALEXin (KEFLEX) 500 MG capsule  4 times daily     12/03/16 1849         Tamala Julian 12/03/16 1943    Davonna Belling, MD 12/03/16 [phone]

## 2016-12-03 NOTE — ED Notes (Signed)
ED Provider at bedside. 

## 2016-12-03 NOTE — Discharge Instructions (Signed)
Please see the information and instructions below regarding your visit.  Your diagnoses today include:  1. Cyst of soft tissue    The lumbar neck appears to be a sebaceous cyst.  It does not appear to be very infected right now, however needs to be monitored by primary care provider to make sure it does not become infected.  Tests performed today include: See side panel of your discharge paperwork for testing performed today. Vital signs are listed at the bottom of these instructions.   Ultrasound of the soft tissue.  Medications prescribed:    Take any prescribed medications only as prescribed, and any over the counter medications only as directed on the packaging.  Please take all of your antibiotics until finished.   You may develop abdominal discomfort or nausea from the antibiotic. If this occurs, you may take it with food. Some patients also get diarrhea with antibiotics. You may help offset this with probiotics which you can buy or get in yogurt. Do not eat or take the probiotics until 2 hours after your antibiotic. Some women develop vaginal yeast infections after antibiotics. If you develop unusual vaginal discharge after being on this medication, please see your primary care provider.   Some people develop allergies to antibiotics. Symptoms of antibiotic allergy can be mild and include a flat rash and itching. They can also be more serious and include:  ?Hives - Hives are raised, red patches of skin that are usually very itchy.  ?Lip or tongue swelling  ?Trouble swallowing or breathing  ?Blistering of the skin or mouth.  If you have any of these serious symptoms, please seek emergency medical care immediately.   Home care instructions:  Please follow any educational materials contained in this packet.   Follow-up instructions: Please follow-up with your primary care provider in 48 hours for further evaluation of your symptoms if they are not completely improved.    Return instructions:  Please return to the Emergency Department if you experience worsening symptoms.  Please return to the emergency department for any development of fevers, stiffness in the neck, or increasing redness around that cyst site. Please return if you have any other emergent concerns.  Additional Information:   Your vital signs today were: BP (!) 111/45 (BP Location: Right Arm)    Pulse 92    Temp 98.2 F (36.8 C) (Oral)    Resp 18    Ht 5\' 8"  (1.727 m)    Wt 113.4 kg (250 lb)    LMP 06/11/2016    SpO2 100%    BMI 38.01 kg/m  If your blood pressure (BP) was elevated on multiple readings during this visit above 130 for the top number or above 80 for the bottom number, please have this repeated by your primary care provider within one month. --------------  Thank you for allowing [place] to participate in your care today.

## 2016-12-18 ENCOUNTER — Inpatient Hospital Stay (HOSPITAL_COMMUNITY): Payer: Medicaid Other

## 2016-12-18 ENCOUNTER — Inpatient Hospital Stay (EMERGENCY_DEPARTMENT_HOSPITAL)
Admission: AD | Admit: 2016-12-18 | Discharge: 2016-12-18 | Disposition: A | Discharge status: Home / Self Care | Payer: Medicaid Other | Source: Ambulatory Visit | Attending: Obstetrics and Gynecology | Admitting: Obstetrics and Gynecology

## 2016-12-18 ENCOUNTER — Encounter (HOSPITAL_COMMUNITY): Payer: Self-pay | Admitting: *Deleted

## 2016-12-18 ENCOUNTER — Inpatient Hospital Stay (HOSPITAL_COMMUNITY)
Admission: AD | Admit: 2016-12-18 | Discharge: 2016-12-25 | DRG: 785 | Disposition: A | Discharge status: Home / Self Care | Payer: Medicaid Other | Source: Ambulatory Visit | Attending: Obstetrics and Gynecology | Admitting: Obstetrics and Gynecology

## 2016-12-18 ENCOUNTER — Encounter (HOSPITAL_COMMUNITY): Payer: Self-pay

## 2016-12-18 DIAGNOSIS — Z3A26 26 weeks gestation of pregnancy: Secondary | ICD-10-CM

## 2016-12-18 DIAGNOSIS — R0602 Shortness of breath: Secondary | ICD-10-CM

## 2016-12-18 DIAGNOSIS — O1413 Severe pre-eclampsia, third trimester: Secondary | ICD-10-CM | POA: Diagnosis present

## 2016-12-18 DIAGNOSIS — O1414 Severe pre-eclampsia complicating childbirth: Principal | ICD-10-CM | POA: Diagnosis present

## 2016-12-18 DIAGNOSIS — Z79899 Other long term (current) drug therapy: Secondary | ICD-10-CM

## 2016-12-18 DIAGNOSIS — O1492 Unspecified pre-eclampsia, second trimester: Secondary | ICD-10-CM | POA: Diagnosis not present

## 2016-12-18 DIAGNOSIS — E119 Type 2 diabetes mellitus without complications: Secondary | ICD-10-CM | POA: Diagnosis present

## 2016-12-18 DIAGNOSIS — Z881 Allergy status to other antibiotic agents status: Secondary | ICD-10-CM

## 2016-12-18 DIAGNOSIS — O9942 Diseases of the circulatory system complicating childbirth: Secondary | ICD-10-CM | POA: Diagnosis present

## 2016-12-18 DIAGNOSIS — O99214 Obesity complicating childbirth: Secondary | ICD-10-CM | POA: Diagnosis present

## 2016-12-18 DIAGNOSIS — O34211 Maternal care for low transverse scar from previous cesarean delivery: Secondary | ICD-10-CM | POA: Diagnosis present

## 2016-12-18 DIAGNOSIS — O162 Unspecified maternal hypertension, second trimester: Secondary | ICD-10-CM

## 2016-12-18 DIAGNOSIS — I517 Cardiomegaly: Secondary | ICD-10-CM | POA: Diagnosis present

## 2016-12-18 DIAGNOSIS — O2412 Pre-existing diabetes mellitus, type 2, in childbirth: Secondary | ICD-10-CM | POA: Diagnosis present

## 2016-12-18 DIAGNOSIS — Z302 Encounter for sterilization: Secondary | ICD-10-CM

## 2016-12-18 DIAGNOSIS — Z7984 Long term (current) use of oral hypoglycemic drugs: Secondary | ICD-10-CM

## 2016-12-18 DIAGNOSIS — J209 Acute bronchitis, unspecified: Secondary | ICD-10-CM

## 2016-12-18 DIAGNOSIS — Z794 Long term (current) use of insulin: Secondary | ICD-10-CM

## 2016-12-18 HISTORY — DX: Type 2 diabetes mellitus without complications: E11.9

## 2016-12-18 LAB — COMPREHENSIVE METABOLIC PANEL
ALK PHOS: 89 U/L (ref 38–126)
ALT: 17 U/L (ref 14–54)
ANION GAP: 11 (ref 5–15)
AST: 27 U/L (ref 15–41)
Albumin: 2.9 g/dL — ABNORMAL LOW (ref 3.5–5.0)
BILIRUBIN TOTAL: 0.4 mg/dL (ref 0.3–1.2)
BUN: 13 mg/dL (ref 6–20)
CALCIUM: 9.5 mg/dL (ref 8.9–10.3)
CO2: 20 mmol/L — ABNORMAL LOW (ref 22–32)
Chloride: 107 mmol/L (ref 101–111)
Creatinine, Ser: 0.48 mg/dL (ref 0.44–1.00)
GFR calc Af Amer: >60 mL/min (ref >60)
Glucose, Bld: 148 mg/dL — ABNORMAL HIGH (ref 65–99)
POTASSIUM: 4.4 mmol/L (ref 3.5–5.1)
Sodium: 138 mmol/L (ref 135–145)
TOTAL PROTEIN: 6.4 g/dL — AB (ref 6.5–8.1)

## 2016-12-18 LAB — URINALYSIS, MICROSCOPIC (REFLEX)

## 2016-12-18 LAB — PROTEIN / CREATININE RATIO, URINE
CREATININE, URINE: 80 mg/dL
PROTEIN CREATININE RATIO: 0.59 mg/mg{creat} — AB (ref 0.00–0.15)
Total Protein, Urine: 47 mg/dL

## 2016-12-18 LAB — URINALYSIS, ROUTINE W REFLEX MICROSCOPIC
BILIRUBIN URINE: NEGATIVE
GLUCOSE, UA: NEGATIVE mg/dL
Ketones, ur: NEGATIVE mg/dL
Leukocytes, UA: NEGATIVE
Nitrite: NEGATIVE
Protein, ur: NEGATIVE mg/dL
pH: 5.5 (ref 5.0–8.0)

## 2016-12-18 LAB — CBC
HEMATOCRIT: 33.7 % — AB (ref 36.0–46.0)
Hemoglobin: 11.1 g/dL — ABNORMAL LOW (ref 12.0–15.0)
MCH: 25.6 pg — AB (ref 26.0–34.0)
MCHC: 32.9 g/dL (ref 30.0–36.0)
MCV: 77.8 fL — AB (ref 78.0–100.0)
Platelets: 357 10*3/uL (ref 150–400)
RBC: 4.33 MIL/uL (ref 3.87–5.11)
RDW: 15.7 % — AB (ref 11.5–15.5)
WBC: 15.2 10*3/uL — ABNORMAL HIGH (ref 4.0–10.5)

## 2016-12-18 MED ORDER — BUTALBITAL-APAP-CAFFEINE 50-325-40 MG PO TABS
1.0000 | ORAL_TABLET | Freq: Once | ORAL | Status: AC
  Administered 2016-12-19: 1 via ORAL
  Filled 2016-12-18: qty 1

## 2016-12-18 MED ORDER — ACETAMINOPHEN 500 MG PO TABS
1000.0000 mg | ORAL_TABLET | Freq: Once | ORAL | Status: AC
  Administered 2016-12-18: 1000 mg via ORAL
  Filled 2016-12-18: qty 2

## 2016-12-18 MED ORDER — LABETALOL HCL 5 MG/ML IV SOLN
20.0000 mg | Freq: Once | INTRAVENOUS | Status: AC
  Administered 2016-12-18: 20 mg via INTRAVENOUS
  Filled 2016-12-18: qty 4

## 2016-12-18 MED ORDER — SODIUM CHLORIDE 0.9 % IV BOLUS (SEPSIS)
500.0000 mL | Freq: Once | INTRAVENOUS | Status: AC
  Administered 2016-12-18: 500 mL via INTRAVENOUS

## 2016-12-18 MED ORDER — LABETALOL HCL 100 MG PO TABS
100.0000 mg | ORAL_TABLET | Freq: Once | ORAL | Status: AC
  Administered 2016-12-18: 100 mg via ORAL
  Filled 2016-12-18: qty 1

## 2016-12-18 MED ORDER — LABETALOL HCL 100 MG PO TABS: 100.0000 mg | ORAL_TABLET | Freq: Two times a day (BID) | ORAL | 0 refills | Status: DC

## 2016-12-18 MED ORDER — OXYCODONE-ACETAMINOPHEN 5-325 MG PO TABS: 2.0000 | ORAL_TABLET | Freq: Once | ORAL | Status: DC

## 2016-12-18 NOTE — Discharge Instructions (Signed)
Hypertension During Pregnancy °Hypertension, commonly called high blood pressure, is when the force of blood pumping through your arteries is too strong. Arteries are blood vessels that carry blood from the heart throughout the body. Hypertension during pregnancy can cause problems for you and your baby. Your baby may be born early (prematurely) or may not weigh as much as he or she should at birth. Very bad cases of hypertension during pregnancy can be life-threatening. °Different types of hypertension can occur during pregnancy. These include: °· Chronic hypertension. This happens when: °? You have hypertension before pregnancy and it continues during pregnancy. °? You develop hypertension before you are [redacted] weeks pregnant, and it continues during pregnancy. °· Gestational hypertension. This is hypertension that develops after the 20th week of pregnancy. °· Preeclampsia, also called toxemia of pregnancy. This is a very serious type of hypertension that develops only during pregnancy. It affects the whole body, and it can be very dangerous for you and your baby. ° °Gestational hypertension and preeclampsia usually go away within 6 weeks after your baby is born. Women who have hypertension during pregnancy have a greater chance of developing hypertension later in life or during future pregnancies. °What are the causes? °The exact cause of hypertension is not known. °What increases the risk? °There are certain factors that make it more likely for you to develop hypertension during pregnancy. These include: °· Having hypertension during a previous pregnancy or prior to pregnancy. °· Being overweight. °· Being older than age 40. °· Being pregnant for the first time or being pregnant with more than one baby. °· Becoming pregnant using fertilization methods such as IVF (in vitro fertilization). °· Having diabetes, kidney problems, or systemic lupus erythematosus. °· Having a family history of hypertension. ° °What are the  signs or symptoms? °Chronic hypertension and gestational hypertension rarely cause symptoms. Preeclampsia causes symptoms, which may include: °· Increased protein in your urine. Your health care provider will check for this at every visit before you give birth (prenatal visit). °· Severe headaches. °· Sudden weight gain. °· Swelling of the hands, face, legs, and feet. °· Nausea and vomiting. °· Vision problems, such as blurred or double vision. °· Numbness in the face, arms, legs, and feet. °· Dizziness. °· Slurred speech. °· Sensitivity to bright lights. °· Abdominal pain. °· Convulsions. ° °How is this diagnosed? °You may be diagnosed with hypertension during a routine prenatal exam. At each prenatal visit, you may: °· Have a urine test to check for high amounts of protein in your urine. °· Have your blood pressure checked. A blood pressure reading is recorded as two numbers, such as "120 over 80" (or 120/80). The first ("top") number is called the systolic pressure. It is a measure of the pressure in your arteries when your heart beats. The second ("bottom") number is called the diastolic pressure. It is a measure of the pressure in your arteries as your heart relaxes between beats. Blood pressure is measured in a unit called mm Hg. A normal blood pressure reading is: °? Systolic: below 120. °? Diastolic: below 80. ° °The type of hypertension that you are diagnosed with depends on your test results and when your symptoms developed. °· Chronic hypertension is usually diagnosed before 20 weeks of pregnancy. °· Gestational hypertension is usually diagnosed after 20 weeks of pregnancy. °· Hypertension with high amounts of protein in the urine is diagnosed as preeclampsia. °· Blood pressure measurements that stay above 160 systolic, or above 110 diastolic, are   signs of severe preeclampsia. ° °How is this treated? °Treatment for hypertension during pregnancy varies depending on the type of hypertension you have and how  serious it is. °· If you take medicines called ACE inhibitors to treat chronic hypertension, you may need to switch medicines. ACE inhibitors should not be taken during pregnancy. °· If you have gestational hypertension, you may need to take blood pressure medicine. °· If you are at risk for preeclampsia, your health care provider may recommend that you take a low-dose aspirin every day to prevent high blood pressure during your pregnancy. °· If you have severe preeclampsia, you may need to be hospitalized so you and your baby can be monitored closely. You may also need to take medicine (magnesium sulfate) to prevent seizures and to lower blood pressure. This medicine may be given as an injection or through an IV tube. °· In some cases, if your condition gets worse, you may need to deliver your baby early. ° °Follow these instructions at home: °Eating and drinking °· Drink enough fluid to keep your urine clear or pale yellow. °· Eat a healthy diet that is low in salt (sodium). Do not add salt to your food. Check food labels to see how much sodium a food or beverage contains. °Lifestyle °· Do not use any products that contain nicotine or tobacco, such as cigarettes and e-cigarettes. If you need help quitting, ask your health care provider. °· Do not use alcohol. °· Avoid caffeine. °· Avoid stress as much as possible. Rest and get plenty of sleep. °General instructions °· Take over-the-counter and prescription medicines only as told by your health care provider. °· While lying down, lie on your left side. This keeps pressure off your baby. °· While sitting or lying down, raise (elevate) your feet. Try putting some pillows under your lower legs. °· Exercise regularly. Ask your health care provider what kinds of exercise are best for you. °· Keep all prenatal and follow-up visits as told by your health care provider. This is important. °Contact a health care provider if: °· You have symptoms that your health care  provider told you may require more treatment or monitoring, such as: °? Fever. °? Vomiting. °? Headache. °Get help right away if: °· You have severe abdominal pain or vomiting that does not get better with treatment. °· You suddenly develop swelling in your hands, ankles, or face. °· You gain 4 lbs (1.8 kg) or more in 1 week. °· You develop vaginal bleeding, or you have blood in your urine. °· You do not feel your baby moving as much as usual. °· You have blurred or double vision. °· You have muscle twitching or sudden tightening (spasms). °· You have shortness of breath. °· Your lips or fingernails turn blue. °This information is not intended to replace advice given to you by your health care provider. Make sure you discuss any questions you have with your health care provider. °Document Released: 09/06/2010 Document Revised: 07/09/2015 Document Reviewed: 06/04/2015 °Elsevier Interactive Patient Education © 2018 [employer] ° °

## 2016-12-18 NOTE — MAU Note (Signed)
Began having shortness of breath around 8  Pm tonight. Called Dr Melba Coon at midnight and was told to come here to MAU.

## 2016-12-18 NOTE — MAU Note (Signed)
PT SAYS  SHE TOOK BP AT [facility].      H/A STARTED  EARLY TODAY-   500 MG  TYLENOL  2 TABS  AT 630PM.    WAS HERE LAST NIGHT.   AND AT [facility]   SAT NIGHT .     TAKES  LABETALOL   200MG  BID.

## 2016-12-18 NOTE — MAU Provider Note (Signed)
History     CSN: [id number]  Arrival date and time: 12/18/16 2221   None     Chief Complaint  Patient presents with  . Hypertension   HPI  41 yo at 26w gestation presents with blood pressure systolic up to 676 at home. Patient admits to headache that is not relieved by tylenol. Denies abdominal pain or vision changes.     Past Medical History:  Diagnosis Date  . Bronchitis    back in 09/2014, inhaler as needed  . Diabetes mellitus without complication ([place])   . Migraine     Past Surgical History:  Procedure Laterality Date  . CESAREAN SECTION    . CHOLECYSTECTOMY N/A 12/01/2014   Procedure: LAPAROSCOPIC CHOLECYSTECTOMY WITH INTRAOPERATIVE CHOLANGIOGRAM;  Surgeon: Jackolyn Confer, MD;  Location: [facility];  Service: General;  Laterality: N/A;  . HERNIA REPAIR    . TOOTH EXTRACTION  2008   1 tooth    Family History  Problem Relation Age of Onset  . Prostate cancer Father   . Diabetes Father   . Heart disease Father   . Hypertension Father   . Diabetes Mother   . Heart disease Mother   . Hypertension Mother     Social History   Tobacco Use  . Smoking status: Never Smoker  . Smokeless tobacco: Never Used  Substance Use Topics  . Alcohol use: No  . Drug use: No    Allergies:  Allergies  Allergen Reactions  . Ciprofloxacin Rash  . Clindamycin/Lincomycin Rash    Medications Prior to Admission  Medication Sig Dispense Refill Last Dose  . acetaminophen (TYLENOL) 500 MG tablet Take 1,000 mg by mouth every 6 (six) hours as needed for moderate pain.   12/18/2016 at Unknown time  . cephALEXin (KEFLEX) 500 MG capsule Take 1 capsule (500 mg total) by mouth 4 (four) times daily. 20 capsule 0 Past Month at Unknown time  . labetalol (NORMODYNE) 100 MG tablet Take 1 tablet (100 mg total) by mouth 2 (two) times daily. 60 tablet 0 12/18/2016 at Unknown time  . metFORMIN (GLUCOPHAGE) 500 MG tablet Take 500 mg by mouth 2 (two) times daily.   Past Week at Unknown time  .  Prenatal MV-Min-Fe Fum-FA-DHA (PRENATAL 1 PO) Take 1 tablet by mouth daily.    Past Month at Unknown time  . ranitidine (ZANTAC) 150 MG tablet Take 150 mg by mouth 3 times/day as needed-between meals & bedtime for heartburn.   12/18/2016 at Unknown time  . amoxicillin (AMOXIL) 500 MG capsule Take 1 capsule (500 mg total) by mouth 3 (three) times daily. (Patient not taking: Reported on 09/26/2016) 21 capsule 0 More than a month at Unknown time  . DICLEGIS 10-10 MG TBEC Take 20 mg by mouth 2 (two) times daily.  1 More than a month at Unknown time  . omeprazole (PRILOSEC) 20 MG capsule Take 1 capsule (20 mg total) by mouth daily. 30 capsule 3 More than a month at Unknown time    Review of Systems   Denies fatigue, fever Vision changes, eye pain Admits to headache. Denies dizziness Denies abdominal pain, nausea Denies chest pain, palpitation Denies shortness of breath, difficulty breathing Denies vaginal bleeding, vaginal discharge  Physical Exam   Blood pressure (!) 159/87, pulse 74, temperature 98.1 F (36.7 C), temperature source Oral, resp. rate 20, height 5\' 3"  (1.6 m), weight 272 lb 8 oz (123.6 kg), last menstrual period 06/11/2016.  Physical Exam  Gen: well appearing, NAD ENT: supple neck,  no thyromegaly Head: no trauma, symmetric Heart: intact distal pulses, normal rate Lungs: no respiratory distress, on room air Abd: soft, non tender Psych: normal judgement, normal behavior  MAU Course  Procedures  MDM Given IV labetolol with improvement in BP. CXR showed same as CTA last night, cardiomegaly and bronchiol thickening. Headache improved with fioricet. Dr. Terri Piedra with admit patient for observation.  Assessment and Plan  1. Preeclampsia- admit for observation  [employer] 12/18/2016, 10:54 PM

## 2016-12-18 NOTE — MAU Provider Note (Cosign Needed)
History     CSN: [id number]  Arrival date and time: 12/18/16 0036   None     Chief Complaint  Patient presents with  . Shortness of Breath   41 yo G4P2012 at [redacted]w[redacted]d presents with acute onset shortness of breath x 2 days. Was seen at [facility] ED for same complaint last night. CTA neg at that time. Was given breathing treatment with some improvement in symptoms. Prescribed antibiotics for possible pneumonia. Elevated blood pressure in ED last night and was prescribed labetolol, which patient took today. Patient denies cough or wheezing.       Past Medical History:  Diagnosis Date  . Bronchitis    back in 09/2014, inhaler as needed  . Diabetes mellitus without complication ([place])   . Migraine     Past Surgical History:  Procedure Laterality Date  . CESAREAN SECTION    . CHOLECYSTECTOMY N/A 12/01/2014   Procedure: LAPAROSCOPIC CHOLECYSTECTOMY WITH INTRAOPERATIVE CHOLANGIOGRAM;  Surgeon: Jackolyn Confer, MD;  Location: [place];  Service: General;  Laterality: N/A;  . HERNIA REPAIR    . TOOTH EXTRACTION  2008   1 tooth    Family History  Problem Relation Age of Onset  . Prostate cancer Father   . Diabetes Father   . Heart disease Father   . Hypertension Father   . Diabetes Mother   . Heart disease Mother   . Hypertension Mother     Social History   Tobacco Use  . Smoking status: Never Smoker  . Smokeless tobacco: Never Used  Substance Use Topics  . Alcohol use: No  . Drug use: No    Allergies:  Allergies  Allergen Reactions  . Ciprofloxacin Rash  . Clindamycin/Lincomycin Rash    Medications Prior to Admission  Medication Sig Dispense Refill Last Dose  . metFORMIN (GLUCOPHAGE) 500 MG tablet Take 500 mg by mouth 2 (two) times daily.   Past Week at Unknown time  . acetaminophen (TYLENOL) 500 MG tablet Take 1,000 mg by mouth every 6 (six) hours as needed for moderate pain.   More than a month at Unknown time  . albuterol (PROVENTIL HFA;VENTOLIN HFA) 108 (90  BASE) MCG/ACT inhaler Inhale 2 puffs into the lungs every 6 (six) hours as needed.   Past Month at Unknown time  . amoxicillin (AMOXIL) 500 MG capsule Take 1 capsule (500 mg total) by mouth 3 (three) times daily. (Patient not taking: Reported on 09/26/2016) 21 capsule 0 Not Taking at Unknown time  . cephALEXin (KEFLEX) 500 MG capsule Take 1 capsule (500 mg total) by mouth 4 (four) times daily. 20 capsule 0   . DICLEGIS 10-10 MG TBEC Take 20 mg by mouth 2 (two) times daily.  1 09/26/2016 at Unknown time  . omeprazole (PRILOSEC) 20 MG capsule Take 1 capsule (20 mg total) by mouth daily. 30 capsule 3 Past Week at Unknown time  . Prenatal MV-Min-Fe Fum-FA-DHA (PRENATAL 1 PO) Take 1 tablet by mouth daily.    09/25/2016 at Unknown time  . ranitidine (ZANTAC) 150 MG tablet Take 150 mg by mouth 3 times/day as needed-between meals & bedtime for heartburn.   09/25/2016 at Unknown time    Review of Systems  Constitutional: Negative for activity change and fever.  HENT: Negative for congestion and ear discharge.   Eyes: Negative for discharge and itching.  Respiratory: Positive for shortness of breath. Negative for cough and wheezing.   Cardiovascular: Negative for chest pain, palpitations and leg swelling.  Gastrointestinal: Negative for  abdominal pain and vomiting.  Endocrine: Negative for cold intolerance and heat intolerance.  Genitourinary: Negative for difficulty urinating and vaginal bleeding.  Musculoskeletal: Negative for arthralgias and back pain.  Neurological: Negative for dizziness and light-headedness.  Hematological: Negative for adenopathy. Does not bruise/bleed easily.   Physical Exam   Blood pressure (!) 156/84, pulse 64, temperature 97.6 F (36.4 C), resp. rate 20, height 5\' 8"  (1.727 m), last menstrual period 06/11/2016, SpO2 100 %.  Physical Exam  Constitutional: She is oriented to person, place, and time. She appears well-developed and well-nourished. No distress.  HENT:  Head:  Normocephalic and atraumatic.  Eyes: Conjunctivae are normal. Pupils are equal, round, and reactive to light.  Neck: Normal range of motion. Neck supple.  Cardiovascular: Normal rate and intact distal pulses.  Respiratory: Breath sounds normal. No respiratory distress. She has no wheezes. She has no rales. She exhibits no tenderness.  tachypneic at times but resolves when patient is distracted  GI: Soft. She exhibits no distension. There is no tenderness.  Musculoskeletal: Normal range of motion. She exhibits no edema.  Neurological: She is alert and oriented to person, place, and time.  Skin: Skin is warm and dry. No erythema.  Psychiatric: She has a normal mood and affect. Her behavior is normal.    MAU Course  Procedures  EFM: 175 bpm/mod var/pos acels/ no decels  MDM Will repeat cbc and cmp due to elevated blood pressure. Check pr/cr. Give labetolol IV now. Give IVF bolus. Discussed plan with Dr. Melba Coon.   0315: Patient's breathing is improved with no real intervention. She is resting comfortably in bed.  Pr/cr 0.59. Discussed with Dr. Melba Coon and she thinks there may be underlying chronic HTN.  Assessment and Plan  1. Pregnancy at 26 weeks completed gestation 2. Preeclampsia vs chronic HTN- cont labetolol. Follow up outpatient. 3. Shortness of breath- improved without intervention  [employer] 12/18/2016, 3:48 AM

## 2016-12-19 ENCOUNTER — Observation Stay (HOSPITAL_COMMUNITY): Payer: Medicaid Other

## 2016-12-19 ENCOUNTER — Observation Stay (HOSPITAL_BASED_OUTPATIENT_CLINIC_OR_DEPARTMENT_OTHER): Payer: Medicaid Other

## 2016-12-19 ENCOUNTER — Other Ambulatory Visit: Payer: Self-pay

## 2016-12-19 DIAGNOSIS — I517 Cardiomegaly: Secondary | ICD-10-CM | POA: Diagnosis not present

## 2016-12-19 DIAGNOSIS — Z3A26 26 weeks gestation of pregnancy: Secondary | ICD-10-CM | POA: Diagnosis not present

## 2016-12-19 DIAGNOSIS — O99512 Diseases of the respiratory system complicating pregnancy, second trimester: Secondary | ICD-10-CM | POA: Diagnosis not present

## 2016-12-19 DIAGNOSIS — Z7984 Long term (current) use of oral hypoglycemic drugs: Secondary | ICD-10-CM | POA: Diagnosis not present

## 2016-12-19 DIAGNOSIS — R0602 Shortness of breath: Secondary | ICD-10-CM | POA: Diagnosis not present

## 2016-12-19 DIAGNOSIS — O162 Unspecified maternal hypertension, second trimester: Secondary | ICD-10-CM | POA: Diagnosis not present

## 2016-12-19 DIAGNOSIS — Z302 Encounter for sterilization: Secondary | ICD-10-CM | POA: Diagnosis not present

## 2016-12-19 DIAGNOSIS — O26892 Other specified pregnancy related conditions, second trimester: Secondary | ICD-10-CM | POA: Diagnosis not present

## 2016-12-19 DIAGNOSIS — O2412 Pre-existing diabetes mellitus, type 2, in childbirth: Secondary | ICD-10-CM | POA: Diagnosis present

## 2016-12-19 DIAGNOSIS — O9942 Diseases of the circulatory system complicating childbirth: Secondary | ICD-10-CM | POA: Diagnosis present

## 2016-12-19 DIAGNOSIS — E119 Type 2 diabetes mellitus without complications: Secondary | ICD-10-CM | POA: Diagnosis present

## 2016-12-19 DIAGNOSIS — O99214 Obesity complicating childbirth: Secondary | ICD-10-CM | POA: Diagnosis present

## 2016-12-19 DIAGNOSIS — O34211 Maternal care for low transverse scar from previous cesarean delivery: Secondary | ICD-10-CM | POA: Diagnosis present

## 2016-12-19 DIAGNOSIS — Z794 Long term (current) use of insulin: Secondary | ICD-10-CM | POA: Diagnosis not present

## 2016-12-19 DIAGNOSIS — O1414 Severe pre-eclampsia complicating childbirth: Secondary | ICD-10-CM | POA: Diagnosis present

## 2016-12-19 DIAGNOSIS — R030 Elevated blood-pressure reading, without diagnosis of hypertension: Secondary | ICD-10-CM | POA: Diagnosis not present

## 2016-12-19 DIAGNOSIS — Z79899 Other long term (current) drug therapy: Secondary | ICD-10-CM | POA: Diagnosis not present

## 2016-12-19 DIAGNOSIS — O09522 Supervision of elderly multigravida, second trimester: Secondary | ICD-10-CM | POA: Diagnosis not present

## 2016-12-19 DIAGNOSIS — O24912 Unspecified diabetes mellitus in pregnancy, second trimester: Secondary | ICD-10-CM | POA: Diagnosis not present

## 2016-12-19 DIAGNOSIS — Z881 Allergy status to other antibiotic agents status: Secondary | ICD-10-CM | POA: Diagnosis not present

## 2016-12-19 DIAGNOSIS — R0600 Dyspnea, unspecified: Secondary | ICD-10-CM

## 2016-12-19 DIAGNOSIS — O1492 Unspecified pre-eclampsia, second trimester: Secondary | ICD-10-CM | POA: Diagnosis not present

## 2016-12-19 LAB — HEPATIC FUNCTION PANEL
ALBUMIN: 2.6 g/dL — AB (ref 3.5–5.0)
ALT: 29 U/L (ref 14–54)
AST: 42 U/L — AB (ref 15–41)
Alkaline Phosphatase: 86 U/L (ref 38–126)
BILIRUBIN DIRECT: 0.1 mg/dL (ref 0.1–0.5)
BILIRUBIN TOTAL: 0.1 mg/dL — AB (ref 0.3–1.2)
Indirect Bilirubin: 0 mg/dL — ABNORMAL LOW (ref 0.3–0.9)
Total Protein: 5.4 g/dL — ABNORMAL LOW (ref 6.5–8.1)

## 2016-12-19 LAB — ECHOCARDIOGRAM COMPLETE
HEIGHTINCHES: 63 in
Weight: 4360 oz

## 2016-12-19 LAB — GLUCOSE, CAPILLARY
GLUCOSE-CAPILLARY: 176 mg/dL — AB (ref 65–99)
GLUCOSE-CAPILLARY: 191 mg/dL — AB (ref 65–99)
Glucose-Capillary: 127 mg/dL — ABNORMAL HIGH (ref 65–99)
Glucose-Capillary: 192 mg/dL — ABNORMAL HIGH (ref 65–99)
Glucose-Capillary: 154 mg/dL — ABNORMAL HIGH (ref 65–99)

## 2016-12-19 LAB — TYPE AND SCREEN
ABO/RH(D): B POS
Antibody Screen: NEGATIVE

## 2016-12-19 LAB — CBC
HEMATOCRIT: 34.3 % — AB (ref 36.0–46.0)
HEMOGLOBIN: 11.3 g/dL — AB (ref 12.0–15.0)
MCH: 25.9 pg — AB (ref 26.0–34.0)
MCHC: 32.9 g/dL (ref 30.0–36.0)
MCV: 78.5 fL (ref 78.0–100.0)
Platelets: 361 10*3/uL (ref 150–400)
RBC: 4.37 MIL/uL (ref 3.87–5.11)
RDW: 15.8 % — ABNORMAL HIGH (ref 11.5–15.5)
WBC: 16.2 10*3/uL — ABNORMAL HIGH (ref 4.0–10.5)

## 2016-12-19 LAB — CREATININE, SERUM
Creatinine, Ser: 0.61 mg/dL (ref 0.44–1.00)
GFR calc Af Amer: >60 mL/min (ref >60)
GFR calc non Af Amer: >60 mL/min (ref >60)

## 2016-12-19 MED ORDER — ZOLPIDEM TARTRATE 5 MG PO TABS: 5.0000 mg | ORAL_TABLET | Freq: Every evening | ORAL | Status: DC | PRN

## 2016-12-19 MED ORDER — LABETALOL HCL 200 MG PO TABS
200.0000 mg | ORAL_TABLET | Freq: Three times a day (TID) | ORAL | Status: DC
  Administered 2016-12-19 – 2016-12-21 (×8): 200 mg via ORAL
  Filled 2016-12-19 (×10): qty 2

## 2016-12-19 MED ORDER — DOCUSATE SODIUM 100 MG PO CAPS
100.0000 mg | ORAL_CAPSULE | Freq: Every day | ORAL | Status: DC
  Administered 2016-12-19 – 2016-12-21 (×2): 100 mg via ORAL
  Filled 2016-12-19 (×3): qty 1

## 2016-12-19 MED ORDER — INSULIN ASPART 100 UNIT/ML ~~LOC~~ SOLN
0.0000 [IU] | Freq: Three times a day (TID) | SUBCUTANEOUS | Status: DC
  Administered 2016-12-19: 4 [IU] via SUBCUTANEOUS
  Administered 2016-12-20: 11 [IU] via SUBCUTANEOUS

## 2016-12-19 MED ORDER — PRENATAL MULTIVITAMIN CH
1.0000 | ORAL_TABLET | Freq: Every day | ORAL | Status: DC
  Filled 2016-12-19 (×2): qty 1

## 2016-12-19 MED ORDER — HYDRALAZINE HCL 20 MG/ML IJ SOLN: 10.0000 mg | Freq: Once | INTRAMUSCULAR | Status: DC | PRN

## 2016-12-19 MED ORDER — BETAMETHASONE SOD PHOS & ACET 6 (3-3) MG/ML IJ SUSP
12.0000 mg | INTRAMUSCULAR | Status: AC
  Administered 2016-12-19 – 2016-12-20 (×2): 12 mg via INTRAMUSCULAR
  Filled 2016-12-19 (×3): qty 2

## 2016-12-19 MED ORDER — PANTOPRAZOLE SODIUM 40 MG PO TBEC
40.0000 mg | DELAYED_RELEASE_TABLET | Freq: Every day | ORAL | Status: DC
  Administered 2016-12-19 – 2016-12-21 (×3): 40 mg via ORAL
  Filled 2016-12-19 (×3): qty 1

## 2016-12-19 MED ORDER — AZITHROMYCIN 250 MG PO TABS
250.0000 mg | ORAL_TABLET | Freq: Every day | ORAL | Status: AC
  Administered 2016-12-19 – 2016-12-20 (×2): 250 mg via ORAL
  Filled 2016-12-19 (×3): qty 1

## 2016-12-19 MED ORDER — ACETAMINOPHEN 325 MG PO TABS
650.0000 mg | ORAL_TABLET | ORAL | Status: DC | PRN
  Administered 2016-12-21 (×2): 650 mg via ORAL
  Filled 2016-12-19 (×2): qty 2

## 2016-12-19 MED ORDER — METFORMIN HCL 500 MG PO TABS
1500.0000 mg | ORAL_TABLET | Freq: Two times a day (BID) | ORAL | Status: DC
  Administered 2016-12-19: 1500 mg via ORAL
  Filled 2016-12-19 (×3): qty 3

## 2016-12-19 MED ORDER — LACTATED RINGERS IV SOLN
INTRAVENOUS | Status: DC
  Administered 2016-12-19 – 2016-12-21 (×2): via INTRAVENOUS

## 2016-12-19 MED ORDER — PROMETHAZINE HCL 25 MG PO TABS
25.0000 mg | ORAL_TABLET | Freq: Four times a day (QID) | ORAL | Status: DC | PRN
  Administered 2016-12-19 – 2016-12-24 (×5): 25 mg via ORAL
  Filled 2016-12-19 (×5): qty 1

## 2016-12-19 MED ORDER — CALCIUM CARBONATE ANTACID 500 MG PO CHEW
2.0000 | CHEWABLE_TABLET | ORAL | Status: DC | PRN
  Filled 2016-12-19: qty 2

## 2016-12-19 MED ORDER — LABETALOL HCL 5 MG/ML IV SOLN
20.0000 mg | INTRAVENOUS | Status: AC | PRN
  Administered 2016-12-21: 40 mg via INTRAVENOUS
  Administered 2016-12-21 (×2): 20 mg via INTRAVENOUS
  Filled 2016-12-19 (×2): qty 4
  Filled 2016-12-19: qty 8

## 2016-12-19 MED ORDER — FUROSEMIDE 20 MG PO TABS
20.0000 mg | ORAL_TABLET | Freq: Two times a day (BID) | ORAL | Status: DC
  Administered 2016-12-19: 20 mg via ORAL
  Filled 2016-12-19 (×3): qty 1

## 2016-12-19 NOTE — Progress Notes (Addendum)
HD#1, [redacted]W[redacted]D, HTN and SOB SOB a little improved this morning, headache improved Afeb, VSS, BP labile 140-180/80-100, last 146/80 FHT- min-mod variability, 140-150, no repetitive decels A/P -HTN-? PIH/preeclampsia vs CHF, BP improved on PO Labetalol 200 mg TID.  Will get ECHO today, 24 hr urine in progress, monitor BP, u/s results pending, got 1st betamethasone this morning -Probable pre-existing Type II DM-will restart Metformin and check CBGs, will probably need to change to insulin while she is in the hospital, we were not sure she would be compliant with insulin as an oupt thus the high dose of Metformin -Previous c-section x 2, needs repeat c-section if needs to be delivered -AMA with 1/17 risk triploidy/Trisomy 18 or 13, she has declined further testing -Had been started on zithromax for URI, has 2 days left, will order

## 2016-12-19 NOTE — Progress Notes (Signed)
  Echocardiogram 2D Echocardiogram has been performed.  Merrie Roof F 12/19/2016, 10:56 AM

## 2016-12-19 NOTE — H&P (Signed)
Pt is a 41yo [id number] at 42 1/[redacted]wks gestation. Pt has been in the ED and MAU daily for the past three days for SOB and elevated BP with persistent HA. She has been diagnosed with bronchitis and mild cardiomegaly ( s/p CT). SOB improved last night with breathing treatment. She was started on antibx for bronchitis and labetalol 100 mg po bid for BP control. EKG nl Pt was seen in office today. BP was mildly improved but she was noted to have a weight gain of 22 lbs over 2 weeks, persistent SOB and wheezing. I started her on 20mg  lasixs and a  24hr urine collection as labs were wnl. PReE precautions were given Pt called a few hours later with report of BP of 190/110. BP still elevated in MAU but responds to iv labetalol. Pt diuresing very well after one dose of lasixs taken before arrival CXR cw CT   VS -  142-180/76-101, 81 EFM - 150s, good for ga TOCO- no contractions SVE - deferred  Urine wnl CXR- mild bronchial thickening due to bronchitis or reactive airway disease,  borderline cardiomegaly, minimal fluid, no subpulmonic effusion  A/P: 03KJ [id number] at 95 1/[redacted]wks gestation with severe range BP, SOB due to bronchitis/reactive airway dz, borderline cardiomegaly        - admit        - Labetalol preE protocol activated        - labetalol increased to 200mg  po q 8hrs        - Restart 24hr urine collection for protein/Cr clearance        - start on BMZ course; first dose 12/18 and next 12/19        - Continue lasix 20mg  bid x 3 days        - MFM consult and [place] today - comanage request        - Fetal monitoring and toco q shift        - carb modified/heart healthy diet; restrict fluids; TEDs        - recheck labs in am        - BP parameters given        -GBS culture obtained        - Hold MgSO4 at this time        - Neonatology consult if delivery indicated

## 2016-12-19 NOTE — MAU Note (Signed)
Transferred pt via wheelchair to rm 312 with husband present and  supportive .

## 2016-12-19 NOTE — Consult Note (Signed)
Maternal Fetal Medicine Consultation  Requesting Provider(s): [place]  Primary OB: [name] Reason for consultation: Severe range blood pressures, shortness of breath  HPI: Laura Kramer is a 41 yo G4P2012 at [redacted]w[redacted]d admitted with severe range blood pressures and shortness of breath. She reports shortness of breath due to a presumed cold for a little over three weeks, for which she has been taking Robitussin. This Friday she felt her throat closing up so she presented to the [facility] ED where evaluation included an essentially normal chest Xray, CT and echocardiogram. She was diagnosed with bronchitis and started on antibiotics as well as labetalol for elevated blood pressures. She presented to the MAU two nights ago for shortness of breath and was subsequently seen in the clinic yesterday where she was found to have elevated blood pressures and a 22 pound weight gain and started on lasix. Last night she again presented to the MAU for further evaluation after calling with increased shortness of breath and severe range blood pressures at home.   On admission her blood pressures were found to be in the 180s/100s, shortness of breath resolved without intervention. She did have a headache that was relieved with fioricet. She received IV labetalol with improvement in Bps and oral labetalol was increased. P/C returned elevated at 0.59. A 24 hour urine was started. Labs were initially normal but this morning showed an increasing trend in LFTs and creatinine. Platelets normal and stable. She received a dose of betamethasone early this morning. Chest x-ray showed mild bronchial thickening and borderline cardiomegaly. Echocardiogram today showed mild LVH with grade 1 diastolic dysfunction, normal systolic function and EF. Obstetric ultrasound today showed BPP 8/8 with EFW 1089g in the 77%ile with AC97%ile. Limited anatomy appeared normal.   Her medical history is also notable for T2DM for which she had been on metformin.  However, she was told to hold this following her CT[facility]PE at [facility] and so has not taken since Friday. Blood sugars have been increasingly elevated in the setting of betamethasone administration and cessation of metformin.   Laura Kramer denies a history of hypertension or preeclampsia in prior pregnancies or outside of pregnancy. She reports receiving consistent primary care even when she is not pregnant.  Obstetric history is notable for two term cesarean deliveries and one first trimester miscarriage.   OB History: OB History    Gravida Para Term Preterm AB Living   4 2 2   1 2    SAB TAB Ectopic Multiple Live Births   1       1      PMH:  Past Medical History:  Diagnosis Date  . Bronchitis    back in 09/2014, inhaler as needed  . Diabetes mellitus without complication ([place])   . Migraine     PSH:  Past Surgical History:  Procedure Laterality Date  . CESAREAN SECTION    . CHOLECYSTECTOMY N/A 12/01/2014   Procedure: LAPAROSCOPIC CHOLECYSTECTOMY WITH INTRAOPERATIVE CHOLANGIOGRAM;  Surgeon: Jackolyn Confer, MD;  Location: [place];  Service: General;  Laterality: N/A;  . HERNIA REPAIR    . TOOTH EXTRACTION  2008   1 tooth   Meds (preadmission): metformin, flexeril prn, tylenol prn, zantac prn Allergies: cipro, clindamycin FH: Noncontributory Soc: Denies tobacco, alcohol, drug use  Review of Systems: no vaginal bleeding or cramping/contractions, no LOF, no nausea/vomiting. All other systems reviewed and are negative.  PE:  BP (!) 142/85 (BP Location: Left Arm)   Pulse 87   Temp 98.5 F (36.9 C) (Oral)  Resp 20   Ht 5\' 3"  (1.6 m)   Wt 272 lb 8 oz (123.6 kg)   LMP 06/11/2016   SpO2 97%   BMI 48.27 kg/m  GEN: appears fatigued Neuro: A/O x 3 CV: RRR Pulm: No increased work of breathing, speaking in full sentences ABD: gravid, NT Extrem: WWP 1+ edema b/l, hand edema b/l  Please see separate document for fetal ultrasound report.  A/P: Laura Kramer is a 41 yo G4P2012 at [redacted]w[redacted]d  admitted with severe range blood pressures, shortness of breath, and generalized edema. Given her severely elevated bloods pressures, elevated urine protein to creatinine ratio, edema and symptoms I believe her clinical picture is consistent with preeclampsia with severe features. Her echo today showed mild LVH and grade 1 diastolic dysfunction but normal systolic function without evidence of cardiomyopathy. This does suggest a history of chronic hypertension, but given that she has never received this diagnosis formally and blood pressures were severely elevated, in either case I would treat her as preeclampsia with severe features. Her blood pressures now seem to be under control on oral labetalol. She denies symptoms at this time. Labs show increasing LFTs and creatinine over two draws, normal platelets. BPP today 8/8; however, review of fetal heart tracing shows intermittent deep decelerations. Currently the fetal heart tracing is appropriate for gestational age. At this time I do not see any indication for immediate delivery, but we discussed my recommendation for continued inpatient admission until delivery with close monitoring of maternal symptoms, BPs, labs, blood glucose, and fetal status.   Recommendations:  1. Continue inpatient admission until delivery at 34 weeks the latest, sooner as indicated. 2. Deliver (patient plans repeat CD) for persistent symptoms, blood pressure uncontrolled on max dose of two agents or refractory to IV agents, severe lab abnormalities, or nonreassuring fetal status. 3. Continue to titrate antihypertensives as needed to maintain blood pressures <160/110. 4. Discontinue lasix. If pulmonary edema develops, delivery is indicated. 5. Repeat CMP today to trend LFTs, creatinine. If continued rise, recommend trending labs every 6-12 hours. If LFTs reach twice the upper limit of normal, or creatinine is 1.1 or greater, delivery is indicated. 6. Given increasing LFTs and  creatinine, discontinue metformin. Would initiate insulin for glycemic control if needed. 7. Check blood sugars at least four times daily - fastings, 2 hour postprandial. 8. Consider NICU consultation. 9. Magnesium prophylaxis if delivery indicated or if headache, visual symptoms recur or refractory severe range blood pressures develop. 10. At least daily NSTs with weekly ultrasound for fluid checks and growth in 3 weeks if undelivered. 11. Continue strict I/Os, deliver for oliguria.   I discussed the above recommendations with the patient and family at length, as well as primary OB Dr. Willis Modena.  Thank you for the opportunity to be a part of the care of [facility] [place]. Please contact our office if we can be of further assistance.   I spent approximately 30 minutes with this patient with over 50% of time spent in face-to-face counseling.  Abram Sander, MD Maternal-Fetal Medicine

## 2016-12-19 NOTE — Progress Notes (Signed)
Note from Dr. Arnoldo Lenis appreciated, I have spoken with her also -preeclampsia with severe features, BP currently stable on Labetalol, monitor closely, d/c lasix, will start magnesium if BP becomes more difficult to control and delivery is indicated -Type II DM, Metformin Dced, will just cover with SSI for now to see how much insulin she requires -Previous c-section x 2, discussed procedure and risks, she definitely wants BTL even though baby will be premature and may have a chromosomal abnormality, she does not want more children

## 2016-12-20 LAB — COMPREHENSIVE METABOLIC PANEL
ALBUMIN: 2.7 g/dL — AB (ref 3.5–5.0)
ALK PHOS: 75 U/L (ref 38–126)
ALT: 28 U/L (ref 14–54)
AST: 26 U/L (ref 15–41)
Anion gap: 11 (ref 5–15)
BILIRUBIN TOTAL: 0.2 mg/dL — AB (ref 0.3–1.2)
BUN: 18 mg/dL (ref 6–20)
CALCIUM: 8.6 mg/dL — AB (ref 8.9–10.3)
CO2: 17 mmol/L — AB (ref 22–32)
CREATININE: 0.72 mg/dL (ref 0.44–1.00)
Chloride: 107 mmol/L (ref 101–111)
GFR calc Af Amer: >60 mL/min (ref >60)
GFR calc non Af Amer: >60 mL/min (ref >60)
GLUCOSE: 170 mg/dL — AB (ref 65–99)
Potassium: 4.1 mmol/L (ref 3.5–5.1)
SODIUM: 135 mmol/L (ref 135–145)
TOTAL PROTEIN: 5.8 g/dL — AB (ref 6.5–8.1)

## 2016-12-20 LAB — GLUCOSE, CAPILLARY
GLUCOSE-CAPILLARY: 261 mg/dL — AB (ref 65–99)
GLUCOSE-CAPILLARY: 185 mg/dL — AB (ref 65–99)
GLUCOSE-CAPILLARY: 208 mg/dL — AB (ref 65–99)
Glucose-Capillary: 180 mg/dL — ABNORMAL HIGH (ref 65–99)

## 2016-12-20 LAB — CBC WITH DIFFERENTIAL/PLATELET
BASOS PCT: 0 %
Basophils Absolute: 0 10*3/uL (ref 0.0–0.1)
EOS ABS: 0 10*3/uL (ref 0.0–0.7)
Eosinophils Relative: 0 %
HEMATOCRIT: 29.5 % — AB (ref 36.0–46.0)
HEMOGLOBIN: 9.9 g/dL — AB (ref 12.0–15.0)
LYMPHS ABS: 2.5 10*3/uL (ref 0.7–4.0)
Lymphocytes Relative: 13 %
MCH: 25.2 pg — AB (ref 26.0–34.0)
MCHC: 33.6 g/dL (ref 30.0–36.0)
MCV: 75.1 fL — ABNORMAL LOW (ref 78.0–100.0)
MONOS PCT: 7 %
Monocytes Absolute: 1.4 10*3/uL — ABNORMAL HIGH (ref 0.1–1.0)
NEUTROS ABS: 15.6 10*3/uL (ref 1.7–7.7)
NEUTROS PCT: 80 %
Platelets: 337 10*3/uL (ref 150–400)
RBC: 3.93 MIL/uL (ref 3.87–5.11)
RDW: 15.6 % — ABNORMAL HIGH (ref 11.5–15.5)
WBC: 19.5 10*3/uL — AB (ref 4.0–10.5)

## 2016-12-20 LAB — PROTEIN, URINE, 24 HOUR
COLLECTION INTERVAL-UPROT: 24 h
PROTEIN, 24H URINE: 986 mg/d — AB (ref 50–100)
Protein, Urine: 58 mg/dL
URINE TOTAL VOLUME-UPROT: 1700 mL

## 2016-12-20 MED ORDER — INSULIN NPH (HUMAN) (ISOPHANE) 100 UNIT/ML ~~LOC~~ SUSP
20.0000 [IU] | Freq: Every day | SUBCUTANEOUS | Status: DC
  Administered 2016-12-20 – 2016-12-21 (×2): 20 [IU] via SUBCUTANEOUS
  Filled 2016-12-20: qty 10

## 2016-12-20 MED ORDER — INSULIN NPH (HUMAN) (ISOPHANE) 100 UNIT/ML ~~LOC~~ SUSP
10.0000 [IU] | Freq: Every day | SUBCUTANEOUS | Status: DC
  Administered 2016-12-20: 10 [IU] via SUBCUTANEOUS
  Filled 2016-12-20: qty 10

## 2016-12-20 MED ORDER — SALINE SPRAY 0.65 % NA SOLN
1.0000 | NASAL | Status: DC | PRN
  Filled 2016-12-20: qty 44

## 2016-12-20 MED ORDER — INSULIN ASPART 100 UNIT/ML ~~LOC~~ SOLN
0.0000 [IU] | Freq: Four times a day (QID) | SUBCUTANEOUS | Status: DC
  Administered 2016-12-20: 4 [IU] via SUBCUTANEOUS
  Administered 2016-12-20 – 2016-12-21 (×2): 3 [IU] via SUBCUTANEOUS
  Administered 2016-12-22: 2 [IU] via SUBCUTANEOUS
  Administered 2016-12-22 – 2016-12-23 (×4): 3 [IU] via SUBCUTANEOUS
  Administered 2016-12-23 (×3): 2 [IU] via SUBCUTANEOUS
  Administered 2016-12-24 (×2): 3 [IU] via SUBCUTANEOUS
  Administered 2016-12-25: 2 [IU] via SUBCUTANEOUS
  Administered 2016-12-25: 1 [IU] via SUBCUTANEOUS

## 2016-12-20 NOTE — Progress Notes (Signed)
24 hr urine collection completed and sent to lab.

## 2016-12-20 NOTE — Progress Notes (Addendum)
Patient ID: Laura Kramer, female   DOB: 04/01/1975, 41 y.o.   MRN: [id number]   PreEclampsia with severe features/DM   +FM, no LOF, no VB, no ctx; no PIH sx's  AFVSS, last BP 160/100 will check again gen NAD FHTs 150-160's, mod var, appropriate for gestation toco none Abd soft, gravid, NT  Close monitoring of BP and sugars. Close fetal monitoring  Note for court

## 2016-12-20 NOTE — Progress Notes (Signed)
Initial Nutrition Assessment  DOCUMENTATION CODES:   Morbid obesity  INTERVENTION:  Gestational Diabetic carbohydrate modified diet  NUTRITION DIAGNOSIS:  Altered nutrition lab value related to (r/t Hx of DM and pregnancy) as evidenced by (elevated CBG's).  GOAL:  Patient will meet greater than or equal to 90% of their needs  MONITOR:  Labs  REASON FOR ASSESSMENT:   Antenatal, Gestational Diabetes   ASSESSMENT:   26 2/7 weeks, preeclampsia, bronchitis, mild cardiomegaly, DM. Pregravid weight 236 lbs, BMI 41.9. 36 lb weight gain, 22 of which occured in past 2 weeks/edema.   Reports good appetite  Diet Order:  Diet gestational carb mod Fluid consistency: Thin; Room service appropriate? Yes  EDUCATION NEEDS:   No education needs have been identified at this time  Skin:  Skin Assessment: Reviewed RN Assessment   Height:   Ht Readings from Last 1 Encounters:  12/18/16 5\' 3"  (1.6 m)   Weight:   Wt Readings from Last 1 Encounters:  12/18/16 272 lb 8 oz (123.6 kg)    Ideal Body Weight:   115 lbs  BMI:  Body mass index is 48.27 kg/m.  Estimated Nutritional Needs:   Kcal:  2200-2400  Protein:  100-110g  Fluid:  2.5 L    Weyman Rodney M.Fredderick Severance LDN Neonatal Nutrition Support Specialist/RD III Pager [phone]      Phone [phone]

## 2016-12-20 NOTE — Progress Notes (Addendum)
Patient ID: Laura Kramer, female   DOB: [birth year], 41 y.o.   MRN: [id number]   PreEclampsia with severe features/DM  FHTs 150-160's mod var, variables category 1-2 toco none   FSBS 180-261 Diabetic educator saw pt today, adjusted her insulin dose S/p BMZ  BP - 129-165/71-102 (140/77)  Labetalol 200mg  bid  24hr urine 980mg  protein  Close monitoring, deliver with worsening severe features or fetal indications D/C continuous monitoring - 20 min strip q 4 hr

## 2016-12-20 NOTE — Progress Notes (Signed)
All CBG elevated, BP stable, other labs pending Started on low dose insulin, NPH 20 units in am, 10 units in PM, continue SSI with novolog

## 2016-12-21 ENCOUNTER — Encounter (HOSPITAL_COMMUNITY)
Admission: AD | Disposition: A | Discharge status: Home / Self Care | Payer: Self-pay | Source: Ambulatory Visit | Attending: Obstetrics and Gynecology

## 2016-12-21 ENCOUNTER — Inpatient Hospital Stay (HOSPITAL_COMMUNITY): Payer: Medicaid Other

## 2016-12-21 ENCOUNTER — Inpatient Hospital Stay (HOSPITAL_COMMUNITY): Payer: Medicaid Other | Admitting: Anesthesiology

## 2016-12-21 ENCOUNTER — Encounter (HOSPITAL_COMMUNITY): Payer: Self-pay | Admitting: Obstetrics

## 2016-12-21 DIAGNOSIS — O1413 Severe pre-eclampsia, third trimester: Secondary | ICD-10-CM | POA: Diagnosis present

## 2016-12-21 LAB — COMPREHENSIVE METABOLIC PANEL
ALBUMIN: 2.5 g/dL — AB (ref 3.5–5.0)
ALK PHOS: 69 U/L (ref 38–126)
ALT: 23 U/L (ref 14–54)
ALT: 25 U/L (ref 14–54)
ANION GAP: 9 (ref 5–15)
AST: 25 U/L (ref 15–41)
AST: 29 U/L (ref 15–41)
Albumin: 2.7 g/dL — ABNORMAL LOW (ref 3.5–5.0)
Alkaline Phosphatase: 66 U/L (ref 38–126)
Anion gap: 10 (ref 5–15)
BUN: 20 mg/dL (ref 6–20)
BUN: 20 mg/dL (ref 6–20)
CALCIUM: 8.6 mg/dL — AB (ref 8.9–10.3)
CHLORIDE: 110 mmol/L (ref 101–111)
CO2: 18 mmol/L — AB (ref 22–32)
CO2: 17 mmol/L — AB (ref 22–32)
CREATININE: 0.7 mg/dL (ref 0.44–1.00)
Calcium: 8.6 mg/dL — ABNORMAL LOW (ref 8.9–10.3)
Chloride: 109 mmol/L (ref 101–111)
Creatinine, Ser: 0.72 mg/dL (ref 0.44–1.00)
GFR calc Af Amer: >60 mL/min (ref >60)
GFR calc non Af Amer: >60 mL/min (ref >60)
GFR calc non Af Amer: >60 mL/min (ref >60)
GLUCOSE: 155 mg/dL — AB (ref 65–99)
Glucose, Bld: 161 mg/dL — ABNORMAL HIGH (ref 65–99)
POTASSIUM: 4.1 mmol/L (ref 3.5–5.1)
Potassium: 4.1 mmol/L (ref 3.5–5.1)
SODIUM: 137 mmol/L (ref 135–145)
SODIUM: 136 mmol/L (ref 135–145)
Total Bilirubin: 0.4 mg/dL (ref 0.3–1.2)
Total Bilirubin: 0.2 mg/dL — ABNORMAL LOW (ref 0.3–1.2)
Total Protein: 5.5 g/dL — ABNORMAL LOW (ref 6.5–8.1)
Total Protein: 5.8 g/dL — ABNORMAL LOW (ref 6.5–8.1)

## 2016-12-21 LAB — CBC
HCT: 27.2 % — ABNORMAL LOW (ref 36.0–46.0)
HCT: 27.8 % — ABNORMAL LOW (ref 36.0–46.0)
HEMOGLOBIN: 9.5 g/dL — AB (ref 12.0–15.0)
Hemoglobin: 9.2 g/dL — ABNORMAL LOW (ref 12.0–15.0)
MCH: 25.8 pg — ABNORMAL LOW (ref 26.0–34.0)
MCH: 26.1 pg (ref 26.0–34.0)
MCHC: 33.8 g/dL (ref 30.0–36.0)
MCHC: 34.2 g/dL (ref 30.0–36.0)
MCV: 76.2 fL — ABNORMAL LOW (ref 78.0–100.0)
MCV: 76.4 fL — AB (ref 78.0–100.0)
Platelets: 330 10*3/uL (ref 150–400)
Platelets: 335 10*3/uL (ref 150–400)
RBC: 3.57 MIL/uL — ABNORMAL LOW (ref 3.87–5.11)
RBC: 3.64 MIL/uL — AB (ref 3.87–5.11)
RDW: 16 % — AB (ref 11.5–15.5)
RDW: 16 % — ABNORMAL HIGH (ref 11.5–15.5)
WBC: 18.4 10*3/uL — ABNORMAL HIGH (ref 4.0–10.5)
WBC: 18.2 10*3/uL — ABNORMAL HIGH (ref 4.0–10.5)

## 2016-12-21 LAB — GLUCOSE, CAPILLARY
GLUCOSE-CAPILLARY: 151 mg/dL — AB (ref 65–99)
Glucose-Capillary: 157 mg/dL — ABNORMAL HIGH (ref 65–99)
Glucose-Capillary: 106 mg/dL — ABNORMAL HIGH (ref 65–99)
Glucose-Capillary: 153 mg/dL — ABNORMAL HIGH (ref 65–99)

## 2016-12-21 LAB — CULTURE, BETA STREP (GROUP B ONLY)

## 2016-12-21 SURGERY — Surgical Case
Anesthesia: Spinal | Site: Abdomen | Wound class: Clean Contaminated

## 2016-12-21 MED ORDER — PROMETHAZINE HCL 25 MG/ML IJ SOLN: 6.2500 mg | INTRAMUSCULAR | Status: DC | PRN

## 2016-12-21 MED ORDER — FENTANYL CITRATE (PF) 100 MCG/2ML IJ SOLN
INTRAMUSCULAR | Status: DC | PRN
Start: 2016-12-21 — End: 2016-12-21
  Administered 2016-12-21: 25 ug via INTRAVENOUS
  Administered 2016-12-21: 50 ug via INTRAVENOUS
  Administered 2016-12-21: 25 ug via INTRAVENOUS
  Administered 2016-12-21: 50 ug via INTRAVENOUS
  Administered 2016-12-21: 10 ug via INTRATHECAL
  Administered 2016-12-21: 25 ug via INTRAVENOUS

## 2016-12-21 MED ORDER — SODIUM CHLORIDE 0.9 % IR SOLN
Status: DC | PRN
  Administered 2016-12-21: 1000 mL

## 2016-12-21 MED ORDER — DIBUCAINE 1 % RE OINT: 1.0000 "application " | TOPICAL_OINTMENT | RECTAL | Status: DC | PRN

## 2016-12-21 MED ORDER — HYDRALAZINE HCL 20 MG/ML IJ SOLN: 10.0000 mg | Freq: Once | INTRAMUSCULAR | Status: DC | PRN

## 2016-12-21 MED ORDER — OXYTOCIN 10 UNIT/ML IJ SOLN
INTRAMUSCULAR | Status: AC
  Filled 2016-12-21: qty 4

## 2016-12-21 MED ORDER — ONDANSETRON HCL 4 MG/2ML IJ SOLN
INTRAMUSCULAR | Status: DC | PRN
Start: 2016-12-21 — End: 2016-12-21
  Administered 2016-12-21: 4 mg via INTRAVENOUS

## 2016-12-21 MED ORDER — PRENATAL MULTIVITAMIN CH
1.0000 | ORAL_TABLET | Freq: Every day | ORAL | Status: DC
  Administered 2016-12-22 – 2016-12-23 (×2): 1 via ORAL
  Filled 2016-12-21 (×3): qty 1

## 2016-12-21 MED ORDER — BUPIVACAINE IN DEXTROSE 0.75-8.25 % IT SOLN
INTRATHECAL | Status: DC | PRN
  Administered 2016-12-21: 1.6 mL via INTRATHECAL

## 2016-12-21 MED ORDER — SENNOSIDES-DOCUSATE SODIUM 8.6-50 MG PO TABS
2.0000 | ORAL_TABLET | ORAL | Status: DC
  Administered 2016-12-22 – 2016-12-25 (×3): 2 via ORAL
  Filled 2016-12-21 (×4): qty 2

## 2016-12-21 MED ORDER — FENTANYL CITRATE (PF) 100 MCG/2ML IJ SOLN
INTRAMUSCULAR | Status: AC
  Filled 2016-12-21: qty 2

## 2016-12-21 MED ORDER — TETANUS-DIPHTH-ACELL PERTUSSIS 5-2.5-18.5 LF-MCG/0.5 IM SUSP
0.5000 mL | Freq: Once | INTRAMUSCULAR | Status: AC
  Administered 2016-12-22: 0.5 mL via INTRAMUSCULAR
  Filled 2016-12-21: qty 0.5

## 2016-12-21 MED ORDER — LABETALOL HCL 5 MG/ML IV SOLN
20.0000 mg | INTRAVENOUS | Status: DC | PRN
  Administered 2016-12-22: 20 mg via INTRAVENOUS
  Filled 2016-12-21 (×2): qty 4

## 2016-12-21 MED ORDER — DEXTROSE 5 % IV SOLN
3.0000 g | Freq: Once | INTRAVENOUS | Status: AC
  Administered 2016-12-21: 3 g via INTRAVENOUS
  Filled 2016-12-21: qty 3000

## 2016-12-21 MED ORDER — SOD CITRATE-CITRIC ACID 500-334 MG/5ML PO SOLN
ORAL | Status: DC
Start: 2016-12-21 — End: 2016-12-21
  Filled 2016-12-21: qty 15

## 2016-12-21 MED ORDER — OXYCODONE HCL 5 MG PO TABS
5.0000 mg | ORAL_TABLET | Freq: Once | ORAL | Status: AC
  Administered 2016-12-21: 5 mg via ORAL
  Filled 2016-12-21: qty 1

## 2016-12-21 MED ORDER — LACTATED RINGERS IV SOLN
INTRAVENOUS | Status: DC | PRN
  Administered 2016-12-21: 17:00:00 via INTRAVENOUS

## 2016-12-21 MED ORDER — MORPHINE SULFATE (PF) 0.5 MG/ML IJ SOLN
INTRAMUSCULAR | Status: DC | PRN
  Administered 2016-12-21: .2 mg via INTRATHECAL

## 2016-12-21 MED ORDER — SOD CITRATE-CITRIC ACID 500-334 MG/5ML PO SOLN
ORAL | Status: AC
  Filled 2016-12-21: qty 15

## 2016-12-21 MED ORDER — IBUPROFEN 600 MG PO TABS
600.0000 mg | ORAL_TABLET | Freq: Four times a day (QID) | ORAL | Status: DC
  Administered 2016-12-22 – 2016-12-25 (×13): 600 mg via ORAL
  Filled 2016-12-21 (×15): qty 1

## 2016-12-21 MED ORDER — MAGNESIUM SULFATE BOLUS VIA INFUSION
6.0000 g | Freq: Once | INTRAVENOUS | Status: AC
  Administered 2016-12-21: 6 g via INTRAVENOUS
  Filled 2016-12-21: qty 500

## 2016-12-21 MED ORDER — FENTANYL CITRATE (PF) 100 MCG/2ML IJ SOLN
25.0000 ug | INTRAMUSCULAR | Status: DC | PRN
  Administered 2016-12-21: 50 ug via INTRAVENOUS
  Administered 2016-12-21: 25 ug via INTRAVENOUS

## 2016-12-21 MED ORDER — MAGNESIUM SULFATE 40 G IN LACTATED RINGERS - SIMPLE
2.0000 g/h | INTRAVENOUS | Status: AC
  Filled 2016-12-21 (×2): qty 500

## 2016-12-21 MED ORDER — SIMETHICONE 80 MG PO CHEW
80.0000 mg | CHEWABLE_TABLET | ORAL | Status: DC
  Administered 2016-12-22 – 2016-12-25 (×3): 80 mg via ORAL
  Filled 2016-12-21 (×4): qty 1

## 2016-12-21 MED ORDER — LACTATED RINGERS IV SOLN
INTRAVENOUS | Status: DC
  Administered 2016-12-21: 22:00:00 via INTRAVENOUS

## 2016-12-21 MED ORDER — OXYTOCIN 40 UNITS IN LACTATED RINGERS INFUSION - SIMPLE MED
2.5000 [IU]/h | INTRAVENOUS | Status: AC
  Administered 2016-12-21: 2.5 [IU]/h via INTRAVENOUS

## 2016-12-21 MED ORDER — MIDAZOLAM HCL 2 MG/2ML IJ SOLN
INTRAMUSCULAR | Status: AC
  Filled 2016-12-21: qty 2

## 2016-12-21 MED ORDER — ACETAMINOPHEN 325 MG PO TABS
650.0000 mg | ORAL_TABLET | ORAL | Status: DC | PRN
  Administered 2016-12-22 – 2016-12-24 (×4): 650 mg via ORAL
  Filled 2016-12-21 (×4): qty 2

## 2016-12-21 MED ORDER — GLYCOPYRROLATE 0.2 MG/ML IJ SOLN
INTRAMUSCULAR | Status: DC | PRN
  Administered 2016-12-21: 0.1 mg via INTRAVENOUS

## 2016-12-21 MED ORDER — KETAMINE HCL 10 MG/ML IJ SOLN
INTRAMUSCULAR | Status: DC | PRN
Start: 2016-12-21 — End: 2016-12-21
  Administered 2016-12-21 (×4): 10 mg via INTRAVENOUS
  Administered 2016-12-21: 30 mg via INTRAVENOUS

## 2016-12-21 MED ORDER — SOD CITRATE-CITRIC ACID 500-334 MG/5ML PO SOLN
ORAL | Status: AC
  Administered 2016-12-21: 30 mL
  Filled 2016-12-21: qty 15

## 2016-12-21 MED ORDER — SIMETHICONE 80 MG PO CHEW
80.0000 mg | CHEWABLE_TABLET | Freq: Three times a day (TID) | ORAL | Status: DC
  Administered 2016-12-22 – 2016-12-25 (×10): 80 mg via ORAL
  Filled 2016-12-21 (×10): qty 1

## 2016-12-21 MED ORDER — ZOLPIDEM TARTRATE 5 MG PO TABS: 5.0000 mg | ORAL_TABLET | Freq: Every evening | ORAL | Status: DC | PRN

## 2016-12-21 MED ORDER — WITCH HAZEL-GLYCERIN EX PADS: 1.0000 "application " | MEDICATED_PAD | CUTANEOUS | Status: DC | PRN

## 2016-12-21 MED ORDER — ONDANSETRON HCL 4 MG/2ML IJ SOLN
INTRAMUSCULAR | Status: AC
  Filled 2016-12-21: qty 2

## 2016-12-21 MED ORDER — PHENYLEPHRINE 8 MG IN D5W 100 ML (0.08MG/ML) PREMIX OPTIME
INJECTION | INTRAVENOUS | Status: DC | PRN
  Administered 2016-12-21: 30 ug/min via INTRAVENOUS

## 2016-12-21 MED ORDER — SODIUM CHLORIDE 0.9 % IJ SOLN
INTRAMUSCULAR | Status: AC
  Filled 2016-12-21: qty 20

## 2016-12-21 MED ORDER — SIMETHICONE 80 MG PO CHEW: 80.0000 mg | CHEWABLE_TABLET | ORAL | Status: DC | PRN

## 2016-12-21 MED ORDER — OXYCODONE HCL 5 MG PO TABS
10.0000 mg | ORAL_TABLET | ORAL | Status: DC | PRN
  Administered 2016-12-23 – 2016-12-25 (×8): 10 mg via ORAL
  Filled 2016-12-21 (×7): qty 2

## 2016-12-21 MED ORDER — MORPHINE SULFATE (PF) 0.5 MG/ML IJ SOLN
INTRAMUSCULAR | Status: AC
  Filled 2016-12-21: qty 10

## 2016-12-21 MED ORDER — DIPHENHYDRAMINE HCL 25 MG PO CAPS
25.0000 mg | ORAL_CAPSULE | Freq: Four times a day (QID) | ORAL | Status: DC | PRN
  Administered 2016-12-22: 25 mg via ORAL
  Filled 2016-12-21: qty 1

## 2016-12-21 MED ORDER — KETAMINE HCL 10 MG/ML IJ SOLN
INTRAMUSCULAR | Status: AC
  Filled 2016-12-21: qty 1

## 2016-12-21 MED ORDER — COCONUT OIL OIL
1.0000 "application " | TOPICAL_OIL | Status: DC | PRN
  Filled 2016-12-21: qty 120

## 2016-12-21 MED ORDER — SOD CITRATE-CITRIC ACID 500-334 MG/5ML PO SOLN: 30.0000 mL | Freq: Once | ORAL | Status: DC

## 2016-12-21 MED ORDER — OXYCODONE HCL 5 MG PO TABS
5.0000 mg | ORAL_TABLET | ORAL | Status: DC | PRN
  Administered 2016-12-22 (×3): 5 mg via ORAL
  Filled 2016-12-21 (×5): qty 1

## 2016-12-21 MED ORDER — MIDAZOLAM HCL 2 MG/2ML IJ SOLN
INTRAMUSCULAR | Status: DC | PRN
  Administered 2016-12-21: 1 mg via INTRAVENOUS
  Administered 2016-12-21: 2 mg via INTRAVENOUS
  Administered 2016-12-21 (×2): 1 mg via INTRAVENOUS

## 2016-12-21 MED ORDER — SOD CITRATE-CITRIC ACID 500-334 MG/5ML PO SOLN
30.0000 mL | Freq: Once | ORAL | Status: AC
  Administered 2016-12-21: 30 mL via ORAL
  Filled 2016-12-21: qty 15

## 2016-12-21 MED ORDER — OXYTOCIN 10 UNIT/ML IJ SOLN
INTRAMUSCULAR | Status: DC | PRN
  Administered 2016-12-21: 40 [IU] via INTRAVENOUS

## 2016-12-21 MED ORDER — MENTHOL 3 MG MT LOZG: 1.0000 | LOZENGE | OROMUCOSAL | Status: DC | PRN

## 2016-12-21 MED ORDER — DEXTROSE 5 % IV SOLN
INTRAVENOUS | Status: AC
  Filled 2016-12-21: qty 3000

## 2016-12-21 SURGICAL SUPPLY — 45 items
APL SKNCLS STERI-STRIP NONHPOA (GAUZE/BANDAGES/DRESSINGS) ×2
BENZOIN TINCTURE PRP APPL 2/3 (GAUZE/BANDAGES/DRESSINGS) ×3 IMPLANT
CHLORAPREP W/TINT 26ML (MISCELLANEOUS) ×4 IMPLANT
CLAMP CORD UMBIL (MISCELLANEOUS) IMPLANT
CLOSURE WOUND 1/2 X4 (GAUZE/BANDAGES/DRESSINGS) ×1
CLOTH BEACON ORANGE TIMEOUT ST (SAFETY) ×4 IMPLANT
DRSG OPSITE POSTOP 4X10 (GAUZE/BANDAGES/DRESSINGS) ×4 IMPLANT
ELECT REM PT RETURN 9FT ADLT (ELECTROSURGICAL) ×4
ELECTRODE REM PT RTRN 9FT ADLT (ELECTROSURGICAL) ×2 IMPLANT
EXTENDER TRAXI PANNICULUS (MISCELLANEOUS) ×1 IMPLANT
EXTRACTOR VACUUM KIWI (MISCELLANEOUS) IMPLANT
GLOVE BIO SURGEON STRL SZ 6.5 (GLOVE) ×3 IMPLANT
GLOVE BIO SURGEONS STRL SZ 6.5 (GLOVE) ×1
GLOVE BIOGEL PI IND STRL 7.0 (GLOVE) ×2 IMPLANT
GLOVE BIOGEL PI INDICATOR 7.0 (GLOVE) ×2
GOWN STRL REUS W/TWL LRG LVL3 (GOWN DISPOSABLE) ×8 IMPLANT
HEMOSTAT ARISTA ABSORB 3G PWDR (MISCELLANEOUS) ×3 IMPLANT
KIT ABG SYR 3ML LUER SLIP (SYRINGE) IMPLANT
NDL HYPO 25X5/8 SAFETYGLIDE (NEEDLE) IMPLANT
NEEDLE HYPO 25X5/8 SAFETYGLIDE (NEEDLE) IMPLANT
NS IRRIG 1000ML POUR BTL (IV SOLUTION) ×4 IMPLANT
PACK C SECTION WH (CUSTOM PROCEDURE TRAY) ×4 IMPLANT
PAD OB MATERNITY 4.3X12.25 (PERSONAL CARE ITEMS) ×4 IMPLANT
PENCIL SMOKE EVAC W/HOLSTER (ELECTROSURGICAL) ×4 IMPLANT
RETAINER VISCERAL (MISCELLANEOUS) ×3 IMPLANT
RTRCTR C-SECT PINK 25CM LRG (MISCELLANEOUS) ×4 IMPLANT
SPONGE LAP 18X18 X RAY DECT (DISPOSABLE) ×12 IMPLANT
STRIP CLOSURE SKIN 1/2X4 (GAUZE/BANDAGES/DRESSINGS) ×2 IMPLANT
SUT CHROMIC 1 CTX 36 (SUTURE) ×14 IMPLANT
SUT PLAIN 0 NONE (SUTURE) IMPLANT
SUT PLAIN 2 0 XLH (SUTURE) ×4 IMPLANT
SUT VIC AB 0 CT1 27 (SUTURE) ×8
SUT VIC AB 0 CT1 27XBRD ANBCTR (SUTURE) ×4 IMPLANT
SUT VIC AB 0 CT1 36 (SUTURE) ×6 IMPLANT
SUT VIC AB 2-0 CT1 (SUTURE) ×6 IMPLANT
SUT VIC AB 2-0 CT1 27 (SUTURE) ×4
SUT VIC AB 2-0 CT1 TAPERPNT 27 (SUTURE) ×2 IMPLANT
SUT VIC AB 3-0 CT1 27 (SUTURE)
SUT VIC AB 3-0 CT1 TAPERPNT 27 (SUTURE) IMPLANT
SUT VIC AB 3-0 SH 27 (SUTURE) ×12
SUT VIC AB 3-0 SH 27X BRD (SUTURE) ×3 IMPLANT
SUT VIC AB 4-0 KS 27 (SUTURE) ×4 IMPLANT
TOWEL OR 17X24 6PK STRL BLUE (TOWEL DISPOSABLE) ×4 IMPLANT
TRAXI PANNICULUS EXTENDER (MISCELLANEOUS) ×2
TRAY FOLEY BAG SILVER LF 14FR (SET/KITS/TRAYS/PACK) ×4 IMPLANT

## 2016-12-21 NOTE — Progress Notes (Signed)
Patient ID: Laura Kramer, female   DOB: 1975/01/16, 41 y.o.   MRN: [id number] CTSP for feeling worse.   C/o epigastric pain and low right cramping after ate meal.  No further nausea.  Also c/o HA that has not really improved.   Pt was upset with RN and stated care inconsistent and not sure what is going on with her body.  Afraid something will happen to her.  Admits has had good days and bad days since admission, but states feeling worse today than usual  BP 160/90 range on po labetalol 200mg  po TID Abdomen soft NT  FHT 160-170 and some decreased variability, no significant decels  D/w pt her preeclampsia may be progressing and causing her sx, but that we are trying to be sure before we commit to a 26 week delivery  Will get repeat labs I am going to request MFM do a BPP Will hold lunch until determine if pt is going to need to proceed to delivery/repeat c-section IV labetalol to cover BP

## 2016-12-21 NOTE — Transfer of Care (Signed)
Immediate Anesthesia Transfer of Care Note  Patient: Laura Kramer  Procedure(s) Performed: CESAREAN SECTION WITH BILATERAL TUBAL LIGATION (N/A Abdomen)  Patient Location: PACU  Anesthesia Type:Spinal  Level of Consciousness: awake, alert , oriented, drowsy and patient cooperative  Airway & Oxygen Therapy: Patient Spontanous Breathing  Post-op Assessment: Report given to RN and Post -op Vital signs reviewed and stable  Post vital signs: Reviewed and stable  Last Vitals:  Vitals:   12/21/16 1545 12/21/16 1600  BP:  (!) 141/93  Pulse:  70  Resp:  18  Temp: 36.8 C   SpO2:      Last Pain:  Vitals:   12/21/16 1545  TempSrc: Oral  PainSc:       Patients Stated Pain Goal: 3 (11/55/20 8022)  Complications: No apparent anesthesia complications

## 2016-12-21 NOTE — Progress Notes (Signed)
Pt requesting MD to be notified because pt is unhappy with inconsistency of care. Pt reports severe abdominal cramping and headache. Baby placed on monitor, pain med given, vitals done, labs are WDL. BP a little high but BP meds due and given. MD will come talk to patient

## 2016-12-21 NOTE — Progress Notes (Signed)
Patient ID: Laura Kramer, female   DOB: 1975/03/26, 41 y.o.   MRN: [id number] HD #3 Preeclampsia with severe features 26 3/7 weeks  Pt c/o nausea this AM and some headache States got up to take shower about 0430 and has just felt nausea since then, has had before and states phenergan is what works  FHR with occasional variable decels over last 3 days otherwise reassuring for 26 weeks On intermittent monitoring BP overall stable on labetalol 200mg  po TID 140's/70-80 for most part, occasionally 160/80 when labetalol due S/p betamethasone 12/19/16 and 12/20/16  Labs stable this AM Advised patient to hold off on breakfast and treat with phenergan and protonix to see if nausea improves before eating, will reassess in 1 hour FHR Category 1 now Diabetes educator adjusting insulin:   NPH 20 q AM/ 10 q pm SSI 2 hour pp coverage     FBS 155 this AM, so will need to increase PM NPH, daytime levels still elevated 151-261

## 2016-12-21 NOTE — Anesthesia Postprocedure Evaluation (Signed)
Anesthesia Post Note  Patient: Laura Kramer  Procedure(s) Performed: CESAREAN SECTION WITH BILATERAL TUBAL LIGATION (N/A Abdomen)     Patient location during evaluation: PACU Anesthesia Type: Spinal Level of consciousness: awake and alert and oriented Pain management: pain level controlled Vital Signs Assessment: post-procedure vital signs reviewed and stable Respiratory status: spontaneous breathing, nonlabored ventilation and respiratory function stable Cardiovascular status: blood pressure returned to baseline and stable Postop Assessment: no headache, no backache, spinal receding, patient able to bend at knees and no apparent nausea or vomiting Anesthetic complications: no    Last Vitals:  Vitals:   12/21/16 2042 12/21/16 2045  BP:  140/88  Pulse: 65 65  Resp: 14 10  Temp:    SpO2: 91% 91%    Last Pain:  Vitals:   12/21/16 2045  TempSrc:   PainSc: 0-No pain   Pain Goal: Patients Stated Pain Goal: 3 (12/21/16 1534)               Jaira Canady A.

## 2016-12-21 NOTE — Op Note (Signed)
Operative Note    Preoperative Diagnosis Preterm pregnancy at 26 3/7 weeks Prior c-section x 2  Severe preeclaampsia Type 2 DM Desires sterility  Postoperative Diagnosis same  Procedure Repeat c-section with "T incision"  Bilateral distal salpingectomies  Surgeon Paula Compton, MD Maida Sale, MD  Anesthesia Spinal and sedation  Fluids: EBL 1132mL UOP 3104mL clear IVF 631mL  Findings A viable female infant in the vertex presentation.  Apgars 5, 7.  Weight 2#9oz Dense adhesions of anterior uterine fundus to abdominal wall.  Normal ovaries and tubes. Cord pH 7.12  Specimen Placenta to pathology Bilateral tubal segments to pathology  Procedure Note Patient was taken to the operating room where spinal anesthesia was obtained and found to be adequate by Allis clamp test. She was prepped and draped in the normal sterile fashion in the dorsal supine position with a leftward tilt. An appropriate time out was performed. A Pfannenstiel skin incision was then made through a pre-existing scar with the scalpel and carried through to the underlying layer of fascia by sharp dissection and Bovie cautery. The fascia was nicked in the midline and the incision was extended laterally with Mayo scissors. The inferior aspect of the incision was grasped Coker clamps and dissected off the underlying rectus muscles. In a similar fashion the superior aspect was dissected off the rectus muscles. Rectus muscles were separated in the midline and the peritoneal tissue tented up with hemostats and carefully entered sharply.  There were dense adhesions of the uterus to the anterior abdominal wall about 1/3 of the way up the uterine fundus.   These adhesions were taken down with careful dissection.   The patient complained of discomfort throughout the surgery.  The peritoneal incision was then extended both superiorly and inferiorly with careful attention to avoid both bowel and bladder. The Alexis  self-retaining wound retractor was then placed within the incision and the superior portion of the lower uterine segment exposed. It was poorly developed.  The uterus was then incised  in a transverse fashion about 1/2 way up the fundus and the cavity itself entered bluntly. The incision was extended bluntly and with bandage scissors.  The infant's head was then lifted and delivered up to the incision. Due to the patient's body habitus and abdominal girth, fundal pressure was not adequate to effect delivery of the head, therefore the uterine incision was T'd to make more room and the head then delivered without difficulty.  The remainder of the infant delivered and the nose and mouth bulb suctioned with the cord clamped and cut as well. The infant was handed off to the waiting pediatricians. The placenta was then manually removed  from the uterus and the uterus cleared of all clots and debris with moist lap sponge. No obvious residual placental tissue was felt on a manual sweep of the cavity.  The patient tolerated this poorly.  The "T" portion of the incision was repaired in two layers of 0 chromic and then the uterine incision was then repaired in 2 layers the first layer was a running locked layer of 1-0 chromic and the second an imbricating layer of the same suture. The uterine incision was very friable and the tissue pulled through when trying to gain hemostasis.  Laboriously, hemostasis was achieved with multiple figure of eight sutures of 2-0 and 3- vicryl.  When the incision was apparently dry, Arista was placed over the surface of the anterior fundus.  The tubes and ovaries were inspected and the gutters cleared  of all clots and debris.   The tubes were traced out to their fimbriated end, clamped with a Kelly across the distal third and amputated.  Each pedicle was secured with two suture ligatures of 2-0 vicryl.   The uterine incision was inspected and found to be hemostatic. All instruments and sponges as  well as the Alexis retractor were then removed from the abdomen. The rectus muscles and peritoneum were then reapproximated with a running suture of 2-0 Vicryl.  The patient had poor relaxation for this so the fish was used to protect the bowel for closure.   The fascia was then closed with 0 Vicryl in a running fashion. Subcutaneous tissue was reapproximated with 3-0 plain in a running fashion. The skin was closed with a subcuticular stitch of 4-0 Vicryl on a Keith needle and then reinforced with benzoin and Steri-Strips. At the conclusion of the procedure all instruments and sponge counts were correct. Patient was taken to the recovery room in good condition and the baby was in the NICU.

## 2016-12-21 NOTE — Anesthesia Preprocedure Evaluation (Signed)
Anesthesia Evaluation  Patient identified by MRN, date of birth, ID band Patient awake    Reviewed: Allergy & Precautions, NPO status , Patient's Chart, lab work & pertinent test results  Airway Mallampati: II  TM Distance: >3 FB Neck ROM: Full    Dental  (+) Chipped, Dental Advisory Given   Pulmonary neg pulmonary ROS,    Pulmonary exam normal breath sounds clear to auscultation       Cardiovascular hypertension, Normal cardiovascular exam Rhythm:Regular Rate:Normal     Neuro/Psych  Headaches, negative psych ROS   GI/Hepatic negative GI ROS, Neg liver ROS,   Endo/Other  diabetes, Type 2Morbid obesity  Renal/GU negative Renal ROS  negative genitourinary   Musculoskeletal negative musculoskeletal ROS (+)   Abdominal (+) + obese,   Peds negative pediatric ROS (+)  Hematology  (+) anemia ,   Anesthesia Other Findings   Reproductive/Obstetrics (+) Pregnancy Severe preeclampsia                            Anesthesia Physical  Anesthesia Plan  ASA: III  Anesthesia Plan: Spinal   Post-op Pain Management:    Induction: Intravenous  PONV Risk Score and Plan: 3 and Treatment may vary due to age or medical condition and Ondansetron  Airway Management Planned: Natural Airway  Additional Equipment: None  Intra-op Plan:   Post-operative Plan:   Informed Consent: I have reviewed the patients History and Physical, chart, labs and discussed the procedure including the risks, benefits and alternatives for the proposed anesthesia with the patient or authorized representative who has indicated his/her understanding and acceptance.   Dental advisory given  Plan Discussed with: CRNA and Surgeon  Anesthesia Plan Comments:        Anesthesia Quick Evaluation

## 2016-12-21 NOTE — Anesthesia Procedure Notes (Signed)
Spinal  Patient location during procedure: OR Start time: 12/21/2016 5:08 PM End time: 12/21/2016 5:20 PM Staffing Anesthesiologist: Audry Pili, MD Performed: anesthesiologist  Preanesthetic Checklist Completed: patient identified, surgical consent, pre-op evaluation, timeout performed, IV checked, risks and benefits discussed and monitors and equipment checked Spinal Block Patient position: sitting Prep: DuraPrep Patient monitoring: heart rate, cardiac monitor, continuous pulse ox and blood pressure Approach: midline Location: L3-4 Injection technique: single-shot Needle Needle type: Whitacre  Needle gauge: 25 G Needle length: 12.7 cm Assessment Events: paresthesia Additional Notes Functioning IV was confirmed and monitors were applied. Sterile prep and drape, including hand hygiene, mask, and sterile gloves were used. The patient was positioned and the spine was prepped. The skin was anesthetized with lidocaine. After several unsuccessful attempts to locate intrathecal space with pencan, a tuohy was used to find the epidural space. LOR located at depth of 9cm (tuohy hubbed). Initial pass with whitacre needle through tuohy produced left-sided paresthesia. Whitacre removed, Tuohy was withdrawn and redirected slightly to the right. Whitacre needle again advanced through [place].  Free flow of clear CSF was obtained prior to injecting local anesthetic into the CSF. The spinal needle aspirated freely following injection. The needles were carefully withdrawn. The patient tolerated the procedure well. Consent was obtained prior to the procedure with all questions answered and concerns addressed. Risks including, but not limited to, bleeding, infection, nerve damage, paralysis, failed block, inadequate analgesia, allergic reaction, high spinal, itching, and headache were discussed and the patient wished to proceed.  Renold Don, MD

## 2016-12-21 NOTE — Progress Notes (Signed)
Patient ID: Laura Kramer, female   DOB: June 11, 1975, 41 y.o.   MRN: [id number] Upon return from [place] patient continued to c/o severe HA and feeling terrible.  BP persistently elevated to 170/90 range despite IV boluses with labetalol.   FHR still 150-170 with diminished variability  D/w Dr. Burnett Harry of MFM and she agrees it is time to proceed with delivery. Pt counseled on risks and benefits of c-section including bleeding, infection and possible damage to bowel and bladder. She understands the baby is premature and has risks of prematurity and viability but is 826% certain she wishes to proceed with a tubal sterilization procedure.  We discussed permanency and risk of failure of 1/100.   We started a magnesium bolus for neuroprotection and seizure prophylaxis and the IV was lost, will restart with new IV access.   NICU has been notified.   Will proceed when patient and OR ready.

## 2016-12-22 ENCOUNTER — Encounter (HOSPITAL_COMMUNITY): Payer: Self-pay | Admitting: Obstetrics and Gynecology

## 2016-12-22 LAB — COMPREHENSIVE METABOLIC PANEL
ALK PHOS: 66 U/L (ref 38–126)
ALT: 48 U/L (ref 14–54)
ANION GAP: 10 (ref 5–15)
AST: 74 U/L — ABNORMAL HIGH (ref 15–41)
Albumin: 2.6 g/dL — ABNORMAL LOW (ref 3.5–5.0)
BUN: 18 mg/dL (ref 6–20)
CALCIUM: 8 mg/dL — AB (ref 8.9–10.3)
CO2: 19 mmol/L — AB (ref 22–32)
Chloride: 109 mmol/L (ref 101–111)
Creatinine, Ser: 0.76 mg/dL (ref 0.44–1.00)
Glucose, Bld: 172 mg/dL — ABNORMAL HIGH (ref 65–99)
Potassium: 4.2 mmol/L (ref 3.5–5.1)
SODIUM: 138 mmol/L (ref 135–145)
Total Bilirubin: 0.2 mg/dL — ABNORMAL LOW (ref 0.3–1.2)
Total Protein: 5.5 g/dL — ABNORMAL LOW (ref 6.5–8.1)

## 2016-12-22 LAB — GLUCOSE, CAPILLARY
GLUCOSE-CAPILLARY: 136 mg/dL — AB (ref 65–99)
GLUCOSE-CAPILLARY: 187 mg/dL — AB (ref 65–99)
GLUCOSE-CAPILLARY: 182 mg/dL — AB (ref 65–99)
GLUCOSE-CAPILLARY: 154 mg/dL — AB (ref 65–99)
Glucose-Capillary: 198 mg/dL — ABNORMAL HIGH (ref 65–99)

## 2016-12-22 LAB — CBC
HEMATOCRIT: 27.1 % — AB (ref 36.0–46.0)
HEMOGLOBIN: 9.1 g/dL — AB (ref 12.0–15.0)
MCH: 25.6 pg — AB (ref 26.0–34.0)
MCHC: 33.6 g/dL (ref 30.0–36.0)
MCV: 76.3 fL — AB (ref 78.0–100.0)
Platelets: 348 10*3/uL (ref 150–400)
RBC: 3.55 MIL/uL — ABNORMAL LOW (ref 3.87–5.11)
RDW: 16 % — ABNORMAL HIGH (ref 11.5–15.5)
WBC: 22 10*3/uL — ABNORMAL HIGH (ref 4.0–10.5)

## 2016-12-22 MED ORDER — LABETALOL HCL 200 MG PO TABS
200.0000 mg | ORAL_TABLET | Freq: Three times a day (TID) | ORAL | Status: DC
  Administered 2016-12-22 – 2016-12-24 (×9): 200 mg via ORAL
  Filled 2016-12-22 (×4): qty 1
  Filled 2016-12-22: qty 2
  Filled 2016-12-22 (×4): qty 1

## 2016-12-22 NOTE — Progress Notes (Signed)
Patient ID: Laura Kramer, female   DOB: 11-Oct-1975, 41 y.o.   MRN: [id number] Late entry from this am  Pt reports feels well " I have no pain". She is tolerating MgSO4; diuresing well. No HA, blurry  vision or HA. Reports SOB improved. Baby in NICU but doing well  BP elevated overnight; 156-163/91-100  ABD - FF, dressing c/d/i EXT - no homans; scds in place  LFTs increased to 74/48 from 29/25 Plts and Hg stable  A/P: POD#1 s/p Repeat c/s with T incision and BTL for preeclampsia with severe features         Continue on MgSO4 for 24hrs then stop         Continue on labetalol 200mg  po tid; labetalol protocol in place         Routine postpartum/post op care

## 2016-12-22 NOTE — Progress Notes (Signed)
I offered emotional and spiritual support to pt after the birth of her son, TJ who is in the NICU.  She was very receptive and grateful to have someone to talk through some of her feelings.  Seeing her baby in the NICU was very emotional experience because he was so small and although she has 2 children, she has never had one born so early.  Her other children are 38 and 7 and she has a 42 year old step-son who lives in [place] with his grandparents.    She has good support from her husband, from friends and neighbors as well as from her pastor.  We will continue to offer support as we are able, but please also page as needs arise.  [place], [place] Pager, [phone] 2:56 PM     12/22/16 1400  Clinical Encounter Type  Visited With Patient  Visit Type Spiritual support

## 2016-12-22 NOTE — Lactation Note (Signed)
This note was copied from a baby's chart. Lactation Consultation Note  Patient Name: Laura Kramer [id number] Date: 12/22/2016 Reason for consult: Initial assessment;Preterm <34wks;NICU baby Breastfeeding consultation services and Providing Breastmilk For Your NICU baby given and reviewed.  Mom has initiated pumping but would like a review.  Mom shown hand expression and a few drops expressed.  Reviewed pump operation, cleaning and breastmilk storage.  Encouraged to call with concerns or assist prn.  Mom states she plans on purchasing a pump.  Maternal Data Has patient been taught Hand Expression?: Yes Does the patient have breastfeeding experience prior to this delivery?: Yes  Feeding Feeding Type: Donor Breast Milk  LATCH Score                   Interventions    Lactation Tools Discussed/Used WIC Program: No Pump Review: Setup, frequency, and cleaning;Milk Storage Initiated by:: RN Date initiated:: 12/22/16   Consult Status Consult Status: Follow-up Date: 12/23/16 Follow-up type: In-patient    Ave Filter 12/22/2016, 7:24 PM

## 2016-12-22 NOTE — Progress Notes (Signed)
Inpatient Diabetes Program Recommendations  AACE/ADA: New Consensus Statement on Inpatient Glycemic Control (2015)  Target Ranges:  Prepandial:   less than 140 mg/dL      Peak postprandial:   less than 180 mg/dL (1-2 hours)      Critically ill patients:  140 - 180 mg/dL   Lab Results  Component Value Date   GLUCAP 187 (H) 12/22/2016    Review of Glycemic ControlResults for Laura Kramer, Laura Kramer (MRN [id number]) as of 12/22/2016 08:15  Ref. Range 12/21/2016 22:03 12/22/2016 06:06  Glucose-Capillary Latest Ref Range: 65 - 99 mg/dL 153 (H) 187 (H)    Diabetes history: GDM (?Pre-existing type 2?) Outpatient Diabetes medications: Metformin Current orders for Inpatient glycemic control:  Novolog 0-14 units 2 hours post-prandial  Inpatient Diabetes Program Recommendations:    Please change patient to Glycemic control order set-Sensitive tid with meals and HS.  Will discuss with RN.   Thanks, Adah Perl, RN, BC-ADM Inpatient Diabetes Coordinator Pager [phone] (8a-5p)

## 2016-12-23 LAB — GLUCOSE, CAPILLARY
GLUCOSE-CAPILLARY: 151 mg/dL — AB (ref 65–99)
GLUCOSE-CAPILLARY: 149 mg/dL — AB (ref 65–99)
GLUCOSE-CAPILLARY: 155 mg/dL — AB (ref 65–99)
Glucose-Capillary: 192 mg/dL — ABNORMAL HIGH (ref 65–99)

## 2016-12-23 NOTE — Progress Notes (Signed)
Patient ID: Laura Kramer, female   DOB: [birth year], 41 y.o.   MRN: [id number] Pt doing well. Pain controlled with percocet/ibuprofen. No HA or blurry vision. Pumping. No complaints VS - 123-148/71-79 ABD - soft, ND, dressing changed due to serosanguinous drainage - incision well approx; small 1cm area of drainage otherwise c/d/i. New honeycomb placed steristrips in place EXT - no homans  A/P: POD#2 s/p Repeat c/s with T incision and BTL for preeclampsia with severe features         S/p MgSO4 for 24hrs post op          On labetalol 200mg  po tid; BP stable; asymptomatic         Routine postpartum/post op care - may shower

## 2016-12-23 NOTE — Progress Notes (Signed)
Patient ID: Laura Kramer, female   DOB: February 01, 1975, 41 y.o.   MRN: [id number] No comps BP controlled Continue current mgmt Possible d/c to home tomorrow

## 2016-12-23 NOTE — Progress Notes (Signed)
Patient ID: Laura Kramer, female   DOB: 03-12-1975, 41 y.o.   MRN: [id number] Pt awake: just finished walking in hallway and to NICU. Pain better controlled Denies HA, blurry vision. Has voided 5-6 times since foley removed at 1800 yesterday  Last BP 123/71 PE- no acute distress  Plan: continue current mgmt

## 2016-12-24 LAB — GLUCOSE, CAPILLARY
GLUCOSE-CAPILLARY: 121 mg/dL — AB (ref 65–99)
GLUCOSE-CAPILLARY: 163 mg/dL — AB (ref 65–99)
Glucose-Capillary: 184 mg/dL — ABNORMAL HIGH (ref 65–99)

## 2016-12-24 MED ORDER — METFORMIN HCL 500 MG PO TABS
500.0000 mg | ORAL_TABLET | Freq: Two times a day (BID) | ORAL | Status: DC
  Administered 2016-12-24 – 2016-12-25 (×2): 500 mg via ORAL
  Filled 2016-12-24 (×3): qty 1

## 2016-12-24 NOTE — Progress Notes (Signed)
POD #3  Doing ok, some pain, baby stable in NICU Afeb, VSS, BP 130-140/70/90 Abd- soft, fundus firm, dressing C/D/I CBG 121-192 on SSI Continue Labetalol, will restart Metformin 500 mg bid and continue SSI for now, ambulate, d/c home tomorrow unless has a problem

## 2016-12-25 LAB — GLUCOSE, CAPILLARY
GLUCOSE-CAPILLARY: 119 mg/dL — AB (ref 65–99)
Glucose-Capillary: 141 mg/dL — ABNORMAL HIGH (ref 65–99)
Glucose-Capillary: 121 mg/dL — ABNORMAL HIGH (ref 65–99)

## 2016-12-25 MED ORDER — LABETALOL HCL 200 MG PO TABS
400.0000 mg | ORAL_TABLET | Freq: Two times a day (BID) | ORAL | Status: DC
  Administered 2016-12-25: 400 mg via ORAL
  Filled 2016-12-25 (×2): qty 2

## 2016-12-25 MED ORDER — IBUPROFEN 600 MG PO TABS: 600.0000 mg | ORAL_TABLET | Freq: Four times a day (QID) | ORAL | 0 refills | Status: DC

## 2016-12-25 MED ORDER — OXYCODONE HCL 5 MG PO TABS: 5.0000 mg | ORAL_TABLET | Freq: Four times a day (QID) | ORAL | 0 refills | Status: DC | PRN

## 2016-12-25 MED ORDER — LABETALOL HCL 200 MG PO TABS: 400.0000 mg | ORAL_TABLET | Freq: Two times a day (BID) | ORAL | 1 refills | Status: DC

## 2016-12-25 MED ORDER — METFORMIN HCL 500 MG PO TABS: 500.0000 mg | ORAL_TABLET | Freq: Two times a day (BID) | ORAL | 2 refills | Status: DC

## 2016-12-25 NOTE — Progress Notes (Signed)
All discharge teaching completed with the patient. All printed discharge instructions and prescriptions given to the patient. Patient verbalizes an understanding of all instructions. No questions or concerns voiced by patient.

## 2016-12-25 NOTE — Discharge Summary (Signed)
OB Discharge Summary     Patient Name: Laura Kramer DOB: [birth year] MRN: [id number]  Date of admission: 12/18/2016 Delivering MD: Paula Compton   Date of discharge: 12/25/2016  Admitting diagnosis: 26wks Elevated blood pressur headache Intrauterine pregnancy: [redacted]w[redacted]d     Secondary diagnosis:  Active Problems:   HTN complicating peripregnancy, antepartum, second trimester   Preeclampsia, severe, third trimester      Discharge diagnosis: Preterm Pregnancy Delivered, Preeclampsia (severe) and Type 2 DM                                    Hospital course:  Onset of Labor With Unplanned C/S  41 y.o. yo [id number] at [redacted]w[redacted]d was admitted on 12/18/2016 for hypertension, possible preeclampsia.  She had significant proteinuria, elevated/labile BP requiring treatment.  ECHO was done to r/o cardiomyopathy since she had SOB and labile BP, ECHO was normal.  MFM was consulted and agreed with diagnosis of preeclampsia with severe features.  She was given steroids for fetal pulmonary maturation and monitored closely.  She required SSI and eventually scheduled NPH insulin to control her sugars. On 12-20 she developed increasing abdominal pain and headache and the decision was made to proceed with delivery . Membrane Rupture Time/Date: 5:54 PM ,12/21/2016   The patient went for cesarean section and BTL due to Elective Repeat and delivered a Viable infant,12/21/2016  Details of operation can be found in separate operative note. She was initially on magnesium sulfate postpartum and Labetalol to control BP.  She was on SSI and eventually Metformin restarted to control sugars.  She is ambulating,tolerating a regular diet, passing flatus, and urinating well.  Patient is discharged home in stable condition 12/25/16.  Physical exam  Vitals:   12/24/16 2107 12/25/16 0001 12/25/16 0438 12/25/16 0748  BP: (!) 149/92 (!) 151/85 (!) 155/99 (!) 157/91  Pulse: 81  87 77  Resp: 20 20 20 18   Temp: 99 F (37.2 C)  98.9 F (37.2 C) 98.6 F (37 C) 98.5 F (36.9 C)  TempSrc: Oral Oral Oral Oral  SpO2: 100%  100% 99%  Weight:      Height:       General: alert Lochia: appropriate Uterine Fundus: firm Incision: Healing well with no significant drainage  Labs: Lab Results  Component Value Date   WBC 22.0 (H) 12/22/2016   HGB 9.1 (L) 12/22/2016   HCT 27.1 (L) 12/22/2016   MCV 76.3 (L) 12/22/2016   PLT 348 12/22/2016   CMP Latest Ref Rng & Units 12/22/2016  Glucose 65 - 99 mg/dL 172(H)  BUN 6 - 20 mg/dL 18  Creatinine 0.44 - 1.00 mg/dL 0.76  Sodium 135 - 145 mmol/L 138  Potassium 3.5 - 5.1 mmol/L 4.2  Chloride 101 - 111 mmol/L 109  CO2 22 - 32 mmol/L 19(L)  Calcium 8.9 - 10.3 mg/dL 8.0(L)  Total Protein 6.5 - 8.1 g/dL 5.5(L)  Total Bilirubin 0.3 - 1.2 mg/dL 0.2(L)  Alkaline Phos 38 - 126 U/L 66  AST 15 - 41 U/L 74(H)  ALT 14 - 54 U/L 48    Discharge instruction: per After Visit Summary and "Baby and Me Booklet".  After visit meds:  Allergies as of 12/25/2016      Reactions   Ciprofloxacin Rash   Clindamycin/lincomycin Rash      Medication List    STOP taking these medications   amoxicillin 500 MG capsule Commonly known  as:  AMOXIL   cephALEXin 500 MG capsule Commonly known as:  KEFLEX   omeprazole 20 MG capsule Commonly known as:  PRILOSEC   ranitidine 150 MG tablet Commonly known as:  ZANTAC     TAKE these medications   acetaminophen 500 MG tablet Commonly known as:  TYLENOL Take 1,000 mg by mouth every 6 (six) hours as needed for moderate pain.   cyclobenzaprine 10 MG tablet Commonly known as:  FLEXERIL cyclobenzaprine 10 mg tablet  Take 1 tablet 3 times a day, orally.   ibuprofen 600 MG tablet Commonly known as:  ADVIL,MOTRIN Take 1 tablet (600 mg total) by mouth every 6 (six) hours.   labetalol 100 MG tablet Commonly known as:  NORMODYNE Take 1 tablet (100 mg total) by mouth 2 (two) times daily. What changed:  Another medication with the same name was  changed. Make sure you understand how and when to take each.   labetalol 200 MG tablet Commonly known as:  NORMODYNE Take 2 tablets (400 mg total) by mouth 2 (two) times daily. What changed:  how much to take   metFORMIN 500 MG tablet Commonly known as:  GLUCOPHAGE Take 1 tablet (500 mg total) by mouth 2 (two) times daily with a meal. What changed:  when to take this   oxyCODONE 5 MG immediate release tablet Commonly known as:  Oxy IR/ROXICODONE Take 1 tablet (5 mg total) by mouth every 6 (six) hours as needed for severe pain.   promethazine 25 MG tablet Commonly known as:  PHENERGAN TK 1 T PO Q 6 H PRN       Diet: carb modified diet  Activity: Advance as tolerated. Pelvic rest for 6 weeks.   Outpatient follow up:one week Follow up Appt:No future appointments. Follow up Visit:No Follow-up on file.  Postpartum contraception: Tubal Ligation  Newborn Data: Live born female  Birth Weight: 2 lb 7.9 oz (1130 g) APGAR: 5, 7  Newborn Delivery   Birth date/time:  12/21/2016 17:57:00 Delivery type:  C-Section, Low Transverse C-section categorization:  Repeat     Baby Feeding: Breast Disposition:NICU   12/25/2016 Clarene Duke, MD

## 2016-12-25 NOTE — Discharge Instructions (Signed)
As per discharge pamphlet °

## 2016-12-25 NOTE — Lactation Note (Signed)
This note was copied from a baby's chart. Lactation Consultation Note; Follow up visit before DC. Mom has bought Evenflo pump- asking about setup. Assisted mom with pump use. She pumped about 5 ml transitional milk from left breast. Reports breasts are felling fuller this morning. Reviewed pumping rooms in NICU and she can bring Medela pump pieces when she visits baby. Hand expressing as I left room. Pleased to be obtaining more milk. Reviewed engorgement prevention and treatment. Encouraged to pump at least 8 times/24 hours.  No further questions at present. To call prn   Patient Name: Laura Kramer [id number] Date: 12/25/2016 Reason for consult: Follow-up assessment;NICU baby;Preterm <34wks   Maternal Data Formula Feeding for Exclusion: No Has patient been taught Hand Expression?: Yes Does the patient have breastfeeding experience prior to this delivery?: Yes  Feeding    LATCH Score                   Interventions    Lactation Tools Discussed/Used WIC Program: Yes   Consult Status Consult Status: Complete    Truddie Crumble 12/25/2016, 10:15 AM

## 2016-12-25 NOTE — Progress Notes (Signed)
POD #4 Doing well, pain ok Afeb, VSS, BP up a bit 140-160/80-90 Abd- soft, fundus firm, dressing intact CBG-119 to 184 Stable enough for discharge home.  Will change Labetalol to 400 mg bid for easier dosing and increased dose, continue Metformin, recheck in one week

## 2017-01-30 NOTE — Progress Notes (Signed)
Provider notified

## 2017-03-25 ENCOUNTER — Emergency Department (HOSPITAL_BASED_OUTPATIENT_CLINIC_OR_DEPARTMENT_OTHER): Payer: Medicaid Other

## 2017-03-25 ENCOUNTER — Emergency Department (HOSPITAL_BASED_OUTPATIENT_CLINIC_OR_DEPARTMENT_OTHER)
Admission: EM | Admit: 2017-03-25 | Discharge: 2017-03-25 | Disposition: A | Discharge status: Home / Self Care | Payer: Medicaid Other | Attending: Physician Assistant | Admitting: Physician Assistant

## 2017-03-25 ENCOUNTER — Encounter (HOSPITAL_BASED_OUTPATIENT_CLINIC_OR_DEPARTMENT_OTHER): Payer: Self-pay | Admitting: *Deleted

## 2017-03-25 ENCOUNTER — Other Ambulatory Visit: Payer: Self-pay

## 2017-03-25 DIAGNOSIS — S50811A Abrasion of right forearm, initial encounter: Secondary | ICD-10-CM | POA: Insufficient documentation

## 2017-03-25 DIAGNOSIS — Z79899 Other long term (current) drug therapy: Secondary | ICD-10-CM | POA: Diagnosis not present

## 2017-03-25 DIAGNOSIS — Z7984 Long term (current) use of oral hypoglycemic drugs: Secondary | ICD-10-CM | POA: Insufficient documentation

## 2017-03-25 DIAGNOSIS — Y9389 Activity, other specified: Secondary | ICD-10-CM | POA: Diagnosis not present

## 2017-03-25 DIAGNOSIS — S59911A Unspecified injury of right forearm, initial encounter: Secondary | ICD-10-CM | POA: Diagnosis present

## 2017-03-25 DIAGNOSIS — Y929 Unspecified place or not applicable: Secondary | ICD-10-CM | POA: Diagnosis not present

## 2017-03-25 DIAGNOSIS — Y999 Unspecified external cause status: Secondary | ICD-10-CM | POA: Diagnosis not present

## 2017-03-25 HISTORY — DX: Migraine, unspecified, not intractable, without status migrainosus: G43.909

## 2017-03-25 MED ORDER — IBUPROFEN 800 MG PO TABS
800.0000 mg | ORAL_TABLET | Freq: Once | ORAL | Status: AC
  Administered 2017-03-25: 800 mg via ORAL
  Filled 2017-03-25: qty 1
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
## 2017-03-25 NOTE — ED Notes (Signed)
Ice pack given
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
## 2017-08-25 ENCOUNTER — Other Ambulatory Visit: Payer: Self-pay

## 2017-08-25 ENCOUNTER — Encounter (HOSPITAL_BASED_OUTPATIENT_CLINIC_OR_DEPARTMENT_OTHER): Payer: Self-pay

## 2017-08-25 DIAGNOSIS — Z79899 Other long term (current) drug therapy: Secondary | ICD-10-CM | POA: Insufficient documentation

## 2017-08-25 DIAGNOSIS — G43809 Other migraine, not intractable, without status migrainosus: Secondary | ICD-10-CM | POA: Diagnosis not present

## 2017-08-25 DIAGNOSIS — Z7984 Long term (current) use of oral hypoglycemic drugs: Secondary | ICD-10-CM | POA: Diagnosis not present

## 2017-08-25 DIAGNOSIS — R51 Headache: Secondary | ICD-10-CM | POA: Diagnosis present

## 2017-08-25 NOTE — ED Triage Notes (Signed)
Pt reports with migraine, blurred vision, nausea. Hx of same

## 2017-08-26 ENCOUNTER — Emergency Department (HOSPITAL_BASED_OUTPATIENT_CLINIC_OR_DEPARTMENT_OTHER)
Admission: EM | Admit: 2017-08-26 | Discharge: 2017-08-26 | Disposition: A | Discharge status: Home / Self Care | Payer: Medicaid Other | Attending: Emergency Medicine | Admitting: Emergency Medicine

## 2017-08-26 DIAGNOSIS — G43809 Other migraine, not intractable, without status migrainosus: Secondary | ICD-10-CM

## 2017-08-26 MED ORDER — SODIUM CHLORIDE 0.9 % IV BOLUS
1000.0000 mL | Freq: Once | INTRAVENOUS | Status: AC
  Administered 2017-08-26: 1000 mL via INTRAVENOUS

## 2017-08-26 MED ORDER — PROCHLORPERAZINE EDISYLATE 10 MG/2ML IJ SOLN
10.0000 mg | Freq: Once | INTRAMUSCULAR | Status: AC
  Administered 2017-08-26: 10 mg via INTRAVENOUS
  Filled 2017-08-26: qty 2

## 2017-08-26 MED ORDER — DIPHENHYDRAMINE HCL 50 MG/ML IJ SOLN
25.0000 mg | Freq: Once | INTRAMUSCULAR | Status: AC
  Administered 2017-08-26: 25 mg via INTRAVENOUS
  Filled 2017-08-26: qty 1

## 2017-08-26 MED ORDER — DEXAMETHASONE SODIUM PHOSPHATE 10 MG/ML IJ SOLN
10.0000 mg | Freq: Once | INTRAMUSCULAR | Status: AC
  Administered 2017-08-26: 10 mg via INTRAVENOUS
  Filled 2017-08-26: qty 1

## 2017-08-26 MED ORDER — KETOROLAC TROMETHAMINE 15 MG/ML IJ SOLN
15.0000 mg | Freq: Once | INTRAMUSCULAR | Status: AC
  Administered 2017-08-26: 15 mg via INTRAVENOUS
  Filled 2017-08-26: qty 1

## 2017-08-26 NOTE — ED Provider Notes (Signed)
[facility] EMERGENCY DEPARTMENT Provider Note   CSN: [id number] Arrival date & time: 08/25/17  2338     History   Chief Complaint No chief complaint on file.   HPI Laura Kramer is a 42 y.o. female.  HPI 42 year old female with past medical history of chronic migraines here with recurrent headache.  Patient states that over the last 3 days, she has had gradual onset of progressively worsening aching, throbbing headache.  It is localized to the left side.  She had associated photophobia and nausea.  She is tried ibuprofen without relief.  She has a history of similar headaches and states this is similar to her usual migraines, though it is slightly more persistent.  Denies any fevers.  Denies any neck pain or neck stiffness.  No recent URI or other illnesses.  Headache is worse with bright lights and noises.  This is typical for her headaches.  Denies any focal numbness or weakness.  No other complaints.  No recent medication changes.  Her blood pressure has been elevated, but this is usual for her.  Past Medical History:  Diagnosis Date  . Bronchitis    back in 09/2014, inhaler as needed  . Migraine   . Migraines     Patient Active Problem List   Diagnosis Date Noted  . Preeclampsia, severe, third trimester 12/21/2016  . HTN complicating peripregnancy, antepartum, second trimester 12/19/2016  . Gallstones 10/27/2014    Past Surgical History:  Procedure Laterality Date  . CESAREAN SECTION    . CESAREAN SECTION WITH BILATERAL TUBAL LIGATION N/A 12/21/2016   Procedure: CESAREAN SECTION WITH BILATERAL TUBAL LIGATION;  Surgeon: Paula Compton, MD;  Location: [place];  Service: Obstetrics;  Laterality: N/A;  . CHOLECYSTECTOMY N/A 12/01/2014   Procedure: LAPAROSCOPIC CHOLECYSTECTOMY WITH INTRAOPERATIVE CHOLANGIOGRAM;  Surgeon: Jackolyn Confer, MD;  Location: [place] [facility];  Service: General;  Laterality: N/A;  . HERNIA REPAIR    . TOOTH EXTRACTION  2008   1  tooth     OB History    Gravida  4   Para  3   Term  2   Preterm  1   AB  1   Living  3     SAB  1   TAB      Ectopic      Multiple  0   Live Births  2            Home Medications    Prior to Admission medications   Medication Sig Start Date End Date Taking? Authorizing Provider  acetaminophen (TYLENOL) 500 MG tablet Take 1,000 mg by mouth every 6 (six) hours as needed for moderate pain.    [provider]  cyclobenzaprine (FLEXERIL) 10 MG tablet cyclobenzaprine 10 mg tablet  Take 1 tablet 3 times a day, orally.    [provider]  ibuprofen (ADVIL,MOTRIN) 600 MG tablet Take 1 tablet (600 mg total) by mouth every 6 (six) hours. 12/25/16   [name], Sherren Mocha, MD  labetalol (NORMODYNE) 100 MG tablet Take 1 tablet (100 mg total) by mouth 2 (two) times daily. Patient not taking: Reported on 12/19/2016 12/18/16   Dannielle Huh, DO  labetalol (NORMODYNE) 200 MG tablet Take 2 tablets (400 mg total) by mouth 2 (two) times daily. 12/25/16   [name], Sherren Mocha, MD  metFORMIN (GLUCOPHAGE) 500 MG tablet Take 1 tablet (500 mg total) by mouth 2 (two) times daily with a meal. 12/25/16   [name], Sherren Mocha, MD  oxyCODONE (OXY IR/ROXICODONE) 5  MG immediate release tablet Take 1 tablet (5 mg total) by mouth every 6 (six) hours as needed for severe pain. 12/25/16   [name], Sherren Mocha, MD  promethazine (PHENERGAN) 25 MG tablet TK 1 T PO Q 6 H PRN 10/26/16   [provider]    Family History Family History  Problem Relation Age of Onset  . Prostate cancer Father   . Diabetes Father   . Heart disease Father   . Hypertension Father   . Diabetes Mother   . Heart disease Mother   . Hypertension Mother     Social History Social History   Tobacco Use  . Smoking status: Never Smoker  . Smokeless tobacco: Never Used  Substance Use Topics  . Alcohol use: No  . Drug use: No     Allergies   Procardia [nifedipine]; Ciprofloxacin; and  Clindamycin/lincomycin   Review of Systems Review of Systems  Constitutional: Positive for fatigue. Negative for chills and fever.  HENT: Negative for congestion and rhinorrhea.   Eyes: Positive for photophobia. Negative for visual disturbance.  Respiratory: Negative for cough, shortness of breath and wheezing.   Cardiovascular: Negative for chest pain and leg swelling.  Gastrointestinal: Positive for nausea. Negative for abdominal pain, diarrhea and vomiting.  Genitourinary: Negative for dysuria and flank pain.  Musculoskeletal: Negative for neck pain and neck stiffness.  Skin: Negative for rash and wound.  Allergic/Immunologic: Negative for immunocompromised state.  Neurological: Positive for headaches. Negative for syncope and weakness.  All other systems reviewed and are negative.    Physical Exam Updated Vital Signs BP (!) 161/99   Pulse (!) 55   Temp 98.4 F (36.9 C) (Oral)   Resp 20   Ht 5\' 3"  (1.6 m)   Wt 103.4 kg   LMP 08/11/2017   SpO2 98%   BMI 40.39 kg/m   Physical Exam  Constitutional: She is oriented to person, place, and time. She appears well-developed and well-nourished. No distress.  HENT:  Head: Normocephalic and atraumatic.  Normal TMs bilaterally.  Normal mastoid bilaterally.  Oropharynx clear.  Eyes: Conjunctivae are normal.  Neck: Neck supple.  Cardiovascular: Normal rate, regular rhythm and normal heart sounds. Exam reveals no friction rub.  No murmur heard. Pulmonary/Chest: Effort normal and breath sounds normal. No respiratory distress. She has no wheezes. She has no rales.  Abdominal: She exhibits no distension.  Musculoskeletal: She exhibits no edema.  Neurological: She is alert and oriented to person, place, and time. She exhibits normal muscle tone.  Skin: Skin is warm. Capillary refill takes less than 2 seconds.  Psychiatric: She has a normal mood and affect.  Nursing note and vitals reviewed.   Neurological Exam:  Mental Status:  Alert and oriented to person, place, and time. Attention and concentration normal. Speech clear. Recent memory is intact. Cranial Nerves: Visual fields grossly intact. EOMI and PERRLA. No nystagmus noted. Facial sensation intact at forehead, maxillary cheek, and chin/mandible bilaterally. No facial asymmetry or weakness. Hearing grossly normal. Uvula is midline, and palate elevates symmetrically. Normal SCM and trapezius strength. Tongue midline without fasciculations. Motor: Muscle strength 5/5 in proximal and distal UE and LE bilaterally. No pronator drift. Muscle tone normal. Reflexes: 2+ and symmetrical in all four extremities.  Sensation: Intact to light touch in upper and lower extremities distally bilaterally.  Gait: Normal without ataxia. Coordination: Normal FTN bilaterally.    ED Treatments / Results  Labs (all labs ordered are listed, but only abnormal results are displayed) Labs Reviewed -  No data to display  EKG None  Radiology No results found.  Procedures Procedures (including critical care time)  Medications Ordered in ED Medications  sodium chloride 0.9 % bolus 1,000 mL (0 mLs Intravenous Stopped 08/26/17 0307)  prochlorperazine (COMPAZINE) injection 10 mg (10 mg Intravenous Given 08/26/17 0124)  diphenhydrAMINE (BENADRYL) injection 25 mg (25 mg Intravenous Given 08/26/17 0120)  ketorolac (TORADOL) 15 MG/ML injection 15 mg (15 mg Intravenous Given 08/26/17 0120)  dexamethasone (DECADRON) injection 10 mg (10 mg Intravenous Given 08/26/17 0124)     Initial Impression / Assessment and Plan / ED Course  I have reviewed the triage vital signs and the nursing notes.  Pertinent labs & imaging results that were available during my care of the patient were reviewed by me and considered in my medical decision making (see chart for details).     42 year old female here with acute on chronic migraine.  Migraine is similar to her usual headaches, and she has no new red flags.   No fever, chills, neck pain, neck stiffness, recent illness, or signs of meningitis or encephalitis.  Neurological exam is nonfocal and I do not suspect new intracranial mass or stroke.  Headache is not consistent with subarachnoid.  No other concerning symptoms.  Following migraine cocktail, patient had complete relief of her headache and is requesting discharge.  Given her known history of chronic headaches with otherwise well appearance, will discharge with outpatient supportive care and neurology follow-up.  Final Clinical Impressions(s) / ED Diagnoses   Final diagnoses:  Other migraine without status migrainosus, not intractable    ED Discharge Orders    None       Duffy Bruce, MD 08/26/17 [phone]

## 2017-09-20 ENCOUNTER — Encounter (HOSPITAL_BASED_OUTPATIENT_CLINIC_OR_DEPARTMENT_OTHER): Payer: Self-pay | Admitting: *Deleted

## 2017-09-20 ENCOUNTER — Emergency Department (HOSPITAL_BASED_OUTPATIENT_CLINIC_OR_DEPARTMENT_OTHER): Payer: Medicaid Other

## 2017-09-20 ENCOUNTER — Emergency Department (HOSPITAL_BASED_OUTPATIENT_CLINIC_OR_DEPARTMENT_OTHER)
Admission: EM | Admit: 2017-09-20 | Discharge: 2017-09-20 | Disposition: A | Discharge status: Home / Self Care | Payer: Medicaid Other | Attending: Emergency Medicine | Admitting: Emergency Medicine

## 2017-09-20 DIAGNOSIS — Y999 Unspecified external cause status: Secondary | ICD-10-CM | POA: Diagnosis not present

## 2017-09-20 DIAGNOSIS — Z7984 Long term (current) use of oral hypoglycemic drugs: Secondary | ICD-10-CM | POA: Diagnosis not present

## 2017-09-20 DIAGNOSIS — Z79899 Other long term (current) drug therapy: Secondary | ICD-10-CM | POA: Insufficient documentation

## 2017-09-20 DIAGNOSIS — R519 Headache, unspecified: Secondary | ICD-10-CM

## 2017-09-20 DIAGNOSIS — R51 Headache: Secondary | ICD-10-CM | POA: Insufficient documentation

## 2017-09-20 DIAGNOSIS — Y9389 Activity, other specified: Secondary | ICD-10-CM | POA: Diagnosis not present

## 2017-09-20 MED ORDER — SODIUM CHLORIDE 0.9 % IV SOLN
Freq: Once | INTRAVENOUS | Status: AC
  Administered 2017-09-20: 15:00:00 via INTRAVENOUS

## 2017-09-20 MED ORDER — PROCHLORPERAZINE MALEATE 10 MG PO TABS
10.0000 mg | ORAL_TABLET | Freq: Once | ORAL | Status: AC
  Administered 2017-09-20: 10 mg via ORAL
  Filled 2017-09-20: qty 1

## 2017-09-20 MED ORDER — DIPHENHYDRAMINE HCL 50 MG/ML IJ SOLN
25.0000 mg | Freq: Once | INTRAMUSCULAR | Status: AC
  Administered 2017-09-20: 25 mg via INTRAVENOUS
  Filled 2017-09-20: qty 1

## 2017-09-20 MED ORDER — METHOCARBAMOL 500 MG PO TABS: 500.0000 mg | ORAL_TABLET | Freq: Two times a day (BID) | ORAL | 0 refills | Status: DC

## 2017-09-20 MED ORDER — KETOROLAC TROMETHAMINE 15 MG/ML IJ SOLN
15.0000 mg | Freq: Once | INTRAMUSCULAR | Status: AC
  Administered 2017-09-20: 15 mg via INTRAVENOUS
  Filled 2017-09-20: qty 1

## 2017-09-20 MED ORDER — DEXAMETHASONE SODIUM PHOSPHATE 10 MG/ML IJ SOLN
10.0000 mg | Freq: Once | INTRAMUSCULAR | Status: AC
  Administered 2017-09-20: 10 mg via INTRAVENOUS
  Filled 2017-09-20: qty 1

## 2017-09-20 NOTE — ED Notes (Signed)
Patient transported to X-ray 

## 2017-09-20 NOTE — ED Notes (Signed)
ED Provider at bedside. 

## 2017-09-20 NOTE — ED Notes (Addendum)
Pt states she took extra strength tylenol at approx. 0900 today.
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
## 2017-09-20 NOTE — Discharge Instructions (Signed)
Return to the ED immediately for new symptoms, such as fever, neck stiffness, vomiting, passing out, return of symptoms or any concerns at all. Take robaxin as needed for muscle spasms.
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
## 2018-01-02 HISTORY — PX: OTHER SURGICAL HISTORY: SHX169

## 2018-07-07 ENCOUNTER — Encounter (HOSPITAL_BASED_OUTPATIENT_CLINIC_OR_DEPARTMENT_OTHER): Payer: Self-pay | Admitting: Emergency Medicine

## 2018-07-07 ENCOUNTER — Emergency Department (HOSPITAL_BASED_OUTPATIENT_CLINIC_OR_DEPARTMENT_OTHER)
Admission: EM | Admit: 2018-07-07 | Discharge: 2018-07-07 | Disposition: A | Discharge status: Home / Self Care | Payer: Medicaid Other | Attending: Emergency Medicine | Admitting: Emergency Medicine

## 2018-07-07 ENCOUNTER — Other Ambulatory Visit: Payer: Self-pay

## 2018-07-07 ENCOUNTER — Emergency Department (HOSPITAL_BASED_OUTPATIENT_CLINIC_OR_DEPARTMENT_OTHER): Payer: Medicaid Other

## 2018-07-07 DIAGNOSIS — L0291 Cutaneous abscess, unspecified: Secondary | ICD-10-CM

## 2018-07-07 DIAGNOSIS — I10 Essential (primary) hypertension: Secondary | ICD-10-CM | POA: Diagnosis not present

## 2018-07-07 DIAGNOSIS — Z79899 Other long term (current) drug therapy: Secondary | ICD-10-CM | POA: Diagnosis not present

## 2018-07-07 DIAGNOSIS — L0211 Cutaneous abscess of neck: Secondary | ICD-10-CM | POA: Insufficient documentation

## 2018-07-07 DIAGNOSIS — R221 Localized swelling, mass and lump, neck: Secondary | ICD-10-CM | POA: Diagnosis present

## 2018-07-07 LAB — CBC WITH DIFFERENTIAL/PLATELET
Abs Immature Granulocytes: 0.07 10*3/uL (ref 0.00–0.07)
Basophils Absolute: 0.1 10*3/uL (ref 0.0–0.1)
Basophils Relative: 1 %
Eosinophils Absolute: 0.4 10*3/uL (ref 0.0–0.5)
Eosinophils Relative: 3 %
HCT: 34.2 % — ABNORMAL LOW (ref 36.0–46.0)
Hemoglobin: 10.6 g/dL — ABNORMAL LOW (ref 12.0–15.0)
Immature Granulocytes: 1 %
Lymphocytes Relative: 25 %
Lymphs Abs: 3.6 10*3/uL (ref 0.7–4.0)
MCH: 23.4 pg — ABNORMAL LOW (ref 26.0–34.0)
MCHC: 31 g/dL (ref 30.0–36.0)
MCV: 75.5 fL — ABNORMAL LOW (ref 80.0–100.0)
Monocytes Absolute: 0.9 10*3/uL (ref 0.1–1.0)
Monocytes Relative: 6 %
Neutro Abs: 9.4 10*3/uL — ABNORMAL HIGH (ref 1.7–7.7)
Neutrophils Relative %: 64 %
Platelets: 477 10*3/uL — ABNORMAL HIGH (ref 150–400)
RBC: 4.53 MIL/uL (ref 3.87–5.11)
RDW: 15.4 % (ref 11.5–15.5)
WBC: 14.4 10*3/uL — ABNORMAL HIGH (ref 4.0–10.5)
nRBC: 0 % (ref 0.0–0.2)

## 2018-07-07 LAB — COMPREHENSIVE METABOLIC PANEL
ALT: 14 U/L (ref 0–44)
AST: 16 U/L (ref 15–41)
Albumin: 3.9 g/dL (ref 3.5–5.0)
Alkaline Phosphatase: 88 U/L (ref 38–126)
Anion gap: 10 (ref 5–15)
BUN: 12 mg/dL (ref 6–20)
CO2: 24 mmol/L (ref 22–32)
Calcium: 9.2 mg/dL (ref 8.9–10.3)
Chloride: 102 mmol/L (ref 98–111)
Creatinine, Ser: 0.79 mg/dL (ref 0.44–1.00)
GFR calc Af Amer: >60 mL/min (ref >60)
GFR calc non Af Amer: >60 mL/min (ref >60)
Glucose, Bld: 338 mg/dL — ABNORMAL HIGH (ref 70–99)
Potassium: 3.8 mmol/L (ref 3.5–5.1)
Sodium: 136 mmol/L (ref 135–145)
Total Bilirubin: 0.3 mg/dL (ref 0.3–1.2)
Total Protein: 7.1 g/dL (ref 6.5–8.1)

## 2018-07-07 LAB — HCG, QUANTITATIVE, PREGNANCY: hCG, Beta Chain, Quant, S: <1 m[IU]/mL (ref <5)

## 2018-07-07 MED ORDER — HYDROCODONE-ACETAMINOPHEN 5-325 MG PO TABS: 2.0000 | ORAL_TABLET | ORAL | 0 refills | Status: AC | PRN

## 2018-07-07 MED ORDER — SULFAMETHOXAZOLE-TRIMETHOPRIM 800-160 MG PO TABS: 1.0000 | ORAL_TABLET | Freq: Two times a day (BID) | ORAL | 0 refills | Status: DC

## 2018-07-07 MED ORDER — HYDROCODONE-ACETAMINOPHEN 5-325 MG PO TABS
1.0000 | ORAL_TABLET | Freq: Once | ORAL | Status: AC
  Administered 2018-07-07: 22:00:00 1 via ORAL
  Filled 2018-07-07: qty 1

## 2018-07-07 MED ORDER — CEPHALEXIN 500 MG PO CAPS: 500.0000 mg | ORAL_CAPSULE | Freq: Two times a day (BID) | ORAL | 0 refills | Status: DC

## 2018-07-07 MED ORDER — IOHEXOL 300 MG/ML  SOLN
100.0000 mL | Freq: Once | INTRAMUSCULAR | Status: AC | PRN
  Administered 2018-07-07: 75 mL via INTRAVENOUS

## 2018-07-07 MED ORDER — LIDOCAINE HCL (PF) 1 % IJ SOLN
5.0000 mL | Freq: Once | INTRAMUSCULAR | Status: DC
  Filled 2018-07-07: qty 5

## 2018-07-07 NOTE — ED Notes (Signed)
Patient transported to CT 

## 2018-07-07 NOTE — ED Triage Notes (Signed)
Pt states that she noticed a knot to the right posterior neck. Started three days ago and has gotten bigger and more painful. Denies N,, or fever

## 2018-07-07 NOTE — ED Provider Notes (Signed)
[facility] EMERGENCY DEPARTMENT Provider Note   CSN: [id number] Arrival date & time: 07/07/18  1944    History   Chief Complaint Chief Complaint  Patient presents with   Abscess    HPI Laura Kramer is a 43 y.o. female.     43 y.o female with a PMH of HTN presents to the ED with a chief complaint of right neck mass x 2 days. Patient reports noticing pain along the back of her neck 2 days ago, this has continued to get progressively bigger in size. She has not tried any medication for relieve in symptoms. She reports no neck rigidity or fever. Patient denies any headaches, changes in blurry or other complaints.   The history is provided by the patient.  Abscess Associated symptoms: no fever and no vomiting     Past Medical History:  Diagnosis Date   Bronchitis    back in 09/2014, inhaler as needed   Migraine    Migraines     Patient Active Problem List   Diagnosis Date Noted   Preeclampsia, severe, third trimester 76/16/0737   HTN complicating peripregnancy, antepartum, second trimester 12/19/2016   Gallstones 10/27/2014    Past Surgical History:  Procedure Laterality Date   CESAREAN SECTION     CESAREAN SECTION WITH BILATERAL TUBAL LIGATION N/A 12/21/2016   Procedure: CESAREAN SECTION WITH BILATERAL TUBAL LIGATION;  Surgeon: Paula Compton, MD;  Location: [facility];  Service: Obstetrics;  Laterality: N/A;   CHOLECYSTECTOMY N/A 12/01/2014   Procedure: LAPAROSCOPIC CHOLECYSTECTOMY WITH INTRAOPERATIVE CHOLANGIOGRAM;  Surgeon: Jackolyn Confer, MD;  Location: [place];  Service: General;  Laterality: N/A;   HERNIA REPAIR     TOOTH EXTRACTION  2008   1 tooth     OB History    Gravida  4   Para  3   Term  2   Preterm  1   AB  1   Living  3     SAB  1   TAB      Ectopic      Multiple  0   Live Births  2            Home Medications    Prior to Admission medications   Medication Sig Start Date End Date  Taking? Authorizing Provider  acetaminophen (TYLENOL) 500 MG tablet Take 1,000 mg by mouth every 6 (six) hours as needed for moderate pain.    [provider]  cephALEXin (KEFLEX) 500 MG capsule Take 1 capsule (500 mg total) by mouth 2 (two) times daily for 7 days. 07/07/18 07/14/18  Janeece Fitting, PA-C  cyclobenzaprine (FLEXERIL) 10 MG tablet cyclobenzaprine 10 mg tablet  Take 1 tablet 3 times a day, orally.    [provider]  HYDROcodone-acetaminophen (NORCO/VICODIN) 5-325 MG tablet Take 2 tablets by mouth every 4 (four) hours as needed for up to 3 days. 07/07/18 07/10/18  Janeece Fitting, PA-C  ibuprofen (ADVIL,MOTRIN) 600 MG tablet Take 1 tablet (600 mg total) by mouth every 6 (six) hours. 12/25/16   [name], Sherren Mocha, MD  labetalol (NORMODYNE) 100 MG tablet Take 1 tablet (100 mg total) by mouth 2 (two) times daily. Patient not taking: Reported on 12/19/2016 12/18/16   Dannielle Huh, DO  labetalol (NORMODYNE) 200 MG tablet Take 2 tablets (400 mg total) by mouth 2 (two) times daily. 12/25/16   [name], Sherren Mocha, MD  metFORMIN (GLUCOPHAGE) 500 MG tablet Take 1 tablet (500 mg total) by mouth 2 (two) times daily with a  meal. 12/25/16   [name], Sherren Mocha, MD  methocarbamol (ROBAXIN) 500 MG tablet Take 1 tablet (500 mg total) by mouth 2 (two) times daily. 09/20/17   Kendrick, Caitlyn S, PA-C  oxyCODONE (OXY IR/ROXICODONE) 5 MG immediate release tablet Take 1 tablet (5 mg total) by mouth every 6 (six) hours as needed for severe pain. 12/25/16   [name], Sherren Mocha, MD  promethazine (PHENERGAN) 25 MG tablet TK 1 T PO Q 6 H PRN 10/26/16   [provider]  sulfamethoxazole-trimethoprim (BACTRIM DS) 800-160 MG tablet Take 1 tablet by mouth 2 (two) times daily for 7 days. 07/07/18 07/14/18  Janeece Fitting, PA-C    Family History Family History  Problem Relation Age of Onset   Prostate cancer Father    Diabetes Father    Heart disease Father    Hypertension Father    Diabetes Mother    Heart  disease Mother    Hypertension Mother     Social History Social History   Tobacco Use   Smoking status: Never Smoker   Smokeless tobacco: Never Used  Substance Use Topics   Alcohol use: No   Drug use: No     Allergies   Procardia [nifedipine], Ciprofloxacin, and Clindamycin/lincomycin   Review of Systems Review of Systems  Constitutional: Negative for chills and fever.  HENT: Negative for ear pain and sore throat.   Eyes: Negative for pain and visual disturbance.  Respiratory: Negative for cough and shortness of breath.   Cardiovascular: Negative for chest pain and palpitations.  Gastrointestinal: Negative for abdominal pain and vomiting.  Genitourinary: Negative for dysuria and hematuria.  Musculoskeletal: Positive for neck pain. Negative for arthralgias and back pain.  Skin: Positive for wound. Negative for color change and rash.  Neurological: Negative for seizures and syncope.  All other systems reviewed and are negative.    Physical Exam Updated Vital Signs BP 130/85 (BP Location: Left Arm)    Pulse 98    Temp 98.5 F (36.9 C) (Oral)    Resp 16    Ht 5\' 3"  (1.6 m)    Wt 108.9 kg    LMP 06/04/2018    SpO2 100%    BMI 42.51 kg/m   Physical Exam Vitals signs and nursing note reviewed.  Constitutional:      General: She is not in acute distress.    Appearance: She is well-developed.  HENT:     Head: Normocephalic and atraumatic.     Mouth/Throat:     Pharynx: No oropharyngeal exudate.  Eyes:     Pupils: Pupils are equal, round, and reactive to light.  Neck:     Musculoskeletal: Normal range of motion. No erythema or pain with movement.   Cardiovascular:     Rate and Rhythm: Regular rhythm.     Heart sounds: Normal heart sounds.  Pulmonary:     Effort: Pulmonary effort is normal. No respiratory distress.     Breath sounds: Normal breath sounds.  Abdominal:     General: Bowel sounds are normal. There is no distension.     Palpations: Abdomen is  soft.     Tenderness: There is no abdominal tenderness.  Musculoskeletal:        General: No tenderness or deformity.     Right lower leg: No edema.     Left lower leg: No edema.  Lymphadenopathy:     Cervical: Cervical adenopathy present.     Right cervical: Posterior cervical adenopathy present.  Skin:    General: Skin  is warm and dry.  Neurological:     Mental Status: She is alert and oriented to person, place, and time.      ED Treatments / Results  Labs (all labs ordered are listed, but only abnormal results are displayed) Labs Reviewed  CBC WITH DIFFERENTIAL/PLATELET - Abnormal; Notable for the following components:      Result Value   WBC 14.4 (*)    Hemoglobin 10.6 (*)    HCT 34.2 (*)    MCV 75.5 (*)    MCH 23.4 (*)    Platelets 477 (*)    Neutro Abs 9.4 (*)    All other components within normal limits  COMPREHENSIVE METABOLIC PANEL - Abnormal; Notable for the following components:   Glucose, Bld 338 (*)    All other components within normal limits  HCG, QUANTITATIVE, PREGNANCY    EKG None  Radiology Ct Soft Tissue Neck W Contrast  Result Date: 07/07/2018 CLINICAL DATA:  43 year old female with painful palpable abnormality along the posterior right neck for the past 2-3 days. EXAM: CT NECK WITH CONTRAST TECHNIQUE: Multidetector CT imaging of the neck was performed using the standard protocol following the bolus administration of intravenous contrast. CONTRAST:  64mL OMNIPAQUE IOHEXOL 300 MG/ML  SOLN COMPARISON:  [facility] Neck CT 01/20/2012. FINDINGS: Pharynx and larynx: Motion artifact at the supraglottic larynx which appears to remain within normal limits. Normal glottis. Stable pharyngeal soft tissue contours, with chronic adenoid hypertrophy and a retropharyngeal course of both carotid arteries. Otherwise negative retropharyngeal space. Negative parapharyngeal spaces. Salivary glands: Negative sublingual space. The right  submandibular gland is chronically atrophied or absent. The left submandibular gland is within normal limits. The parotid glands are stable and within normal limits. Thyroid: Negative. Lymph nodes: Palpable area of concern marked along the posterior right neck near midline. Underlying the skin marker there is at stellate 24 x 26 x 31 millimeter (AP by transverse by CC) this appears thick walled, with internal intermediate density fluid (series 3, image 43). There is surrounding inflammatory stranding. And there is a mildly enlarged regional right level 5 lymph node on series 3, image 35. A similar but much smaller lesion was present in this same area in 2014. The inflammation extends to the superficial muscle layer. And the lesion looks like it nearly communicates with the skin surface on series 3, image 48. No soft tissue gas. There also reactive appearing right level IIb and IIIb lymph nodes individually up to 10 millimeters short axis. No cystic or necrotic nodes. Vascular: Suboptimal intra vascular contrast bolus but the major vascular structures in the neck and at the skull base appear to remain patent. Limited intracranial: Chronic partially empty sella, otherwise negative. Visualized orbits: Negative. Mastoids and visualized paranasal sinuses: Mild left frontal sinus mucosal thickening, otherwise clear. Tympanic cavities and mastoids are clear. Skeleton: No acute osseous abnormality identified. Upper chest: Negative lung apices and visible mediastinum. No axillary lymphadenopathy. IMPRESSION: 1. Palpable abnormality corresponds to a thick-walled subcutaneous abscess with surrounding inflammation and regional lymphadenopathy. A similar lesion in this area was present on the 2014 comparison. Today it encompasses 24 x 26 x 31 mm, and nearly communicates with the skin surface on series 3, image 48. 2. No other acute findings. 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 Electronically Signed   By: Genevie Ann M.D.   On: 07/07/2018 21:52    Procedures Procedures (including critical care time)  Medications Ordered in ED Medications  HYDROcodone-acetaminophen (NORCO/VICODIN) 5-325 MG per tablet 1 tablet (has no administration in time range)  iohexol (OMNIPAQUE) 300 MG/ML solution 100 mL (75 mLs Intravenous Contrast Given 07/07/18 2127)  lidocaine (PF) (XYLOCAINE) 1 % injection 5 mL (5 mLs Infiltration Given by Other 07/07/18 2140)     Initial Impression / Assessment and Plan / ED Course  I have reviewed the triage vital signs and the nursing notes.  Pertinent labs & imaging results that were available during my care of the patient were reviewed by me and considered in my medical decision making (see chart for details).       Patient with a PMH of sebaceous cyst of the posterior neck presents to the ED with new growth to the back of her right neck.  Reports she has a similar episode with this in the past, was placed on antibiotics and cyst eventually busted on its own.  Reports pain for the last 2 days unable to sleep, worse with movement and pressure.  Blood work along with CT imaging will be ordered to further evaluate patient.  Differential diagnosis included but not limited to sebaceous cyst, abscess, reactive lymph nodes.  CBC showed leukocytosis of 14, this is consistent with patient's previous visits.  Hemoglobin slightly decreased.  CMP showed no electrolyte normality, glucose is elevated at 338.  Creatinine level is within normal limits, LFTs are unremarkable.  hCG is negative.  CT soft tissue neck with contrast showed: 1. Palpable abnormality corresponds to a thick-walled subcutaneous  abscess with surrounding inflammation and regional lymphadenopathy.  A similar lesion in this area was present on the 2014 comparison.  Today it encompasses 24 x 26 x 31 mm, and nearly communicates with  the skin surface on series 3, image 48.  2. No other acute findings. 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     According to records patient deferred I&D last time as she had previously been placed on antibiotics which brought cyst to the top along with having it drained on its own.  Shared decision making with conversation, she reports she would like to consult her options with her husband who is here in the emergency department parking lot.  Given her some time to further discuss this.  Suspect patient will need to go home on antibiotics that she is a diabetic, will prescribe her Keflex 1 tablet twice a day for the next 7 days.  9:59 PM after discussion with husband patient reports she would like to try antibiotic therapy, warm compresses along with short course of pain medication to help with surfacing of abscess. Patient will be discharged on a short course of keflex and bactrim for MRSA coverage. Advised to have wound rechecked within 3 days after starting antibiotic therapy. Patient understands and agrees with management, return precautions provided.   Portions of this note were generated with [employer]. Dictation errors may occur despite best attempts at proofreading.   Final Clinical Impressions(s) / ED Diagnoses   Final diagnoses:  Abscess    ED Discharge Orders         Ordered    cephALEXin (KEFLEX) 500 MG capsule  2 times daily,   Status:  Discontinued     07/07/18 2151    HYDROcodone-acetaminophen (NORCO/VICODIN) 5-325 MG tablet  Every 4 hours PRN     07/07/18 2201    cephALEXin (KEFLEX) 500 MG capsule  2 times daily     07/07/18 2203    sulfamethoxazole-trimethoprim (BACTRIM DS) 800-160 MG  tablet  2 times daily     07/07/18 2203           Janeece Fitting, Hershal Coria 07/07/18 2206    Julianne Rice, MD 07/08/18 [phone]

## 2018-07-07 NOTE — Discharge Instructions (Addendum)
I have prescribed antibiotics to help with your infection, take 1 tablet twice of each antibiotic a day for the next 7 days. Please apply warm presents to the area while you are at home.  I have also prescribed a short course of pain medication, please take this for severe pain.  Please have wound rechecked within 3 days after starting antibiotic therapy either with  your PCP, UC or return to the Emergency department.

## 2018-07-07 NOTE — ED Notes (Signed)
Pt has a ride home. Offered pt a snack with medication. Declined. States she will eat something when she leaves.

## 2018-07-11 ENCOUNTER — Emergency Department (HOSPITAL_BASED_OUTPATIENT_CLINIC_OR_DEPARTMENT_OTHER)
Admission: EM | Admit: 2018-07-11 | Discharge: 2018-07-11 | Disposition: A | Discharge status: Home / Self Care | Payer: Medicaid Other | Attending: Emergency Medicine | Admitting: Emergency Medicine

## 2018-07-11 ENCOUNTER — Other Ambulatory Visit: Payer: Self-pay

## 2018-07-11 ENCOUNTER — Encounter (HOSPITAL_BASED_OUTPATIENT_CLINIC_OR_DEPARTMENT_OTHER): Payer: Self-pay | Admitting: Emergency Medicine

## 2018-07-11 DIAGNOSIS — Z79899 Other long term (current) drug therapy: Secondary | ICD-10-CM | POA: Diagnosis not present

## 2018-07-11 DIAGNOSIS — L0211 Cutaneous abscess of neck: Secondary | ICD-10-CM | POA: Insufficient documentation

## 2018-07-11 DIAGNOSIS — I10 Essential (primary) hypertension: Secondary | ICD-10-CM | POA: Diagnosis not present

## 2018-07-11 MED ORDER — LIDOCAINE-PRILOCAINE 2.5-2.5 % EX CREA
TOPICAL_CREAM | Freq: Once | CUTANEOUS | Status: DC
  Filled 2018-07-11: qty 5

## 2018-07-11 MED ORDER — LIDOCAINE-EPINEPHRINE 1 %-1:100000 IJ SOLN: 10.0000 mL | Freq: Once | INTRAMUSCULAR | Status: DC

## 2018-07-11 MED ORDER — SULFAMETHOXAZOLE-TRIMETHOPRIM 800-160 MG PO TABS: 1.0000 | ORAL_TABLET | Freq: Two times a day (BID) | ORAL | 0 refills | Status: AC

## 2018-07-11 MED ORDER — LIDOCAINE-EPINEPHRINE 1 %-1:100000 IJ SOLN
INTRAMUSCULAR | Status: AC
  Filled 2018-07-11: qty 1

## 2018-07-11 MED ORDER — LIDOCAINE 4 % EX CREA
TOPICAL_CREAM | CUTANEOUS | Status: AC
  Filled 2018-07-11: qty 5

## 2018-07-11 MED ORDER — HYDROCODONE-ACETAMINOPHEN 5-325 MG PO TABS
1.0000 | ORAL_TABLET | Freq: Once | ORAL | Status: AC
  Administered 2018-07-11: 1 via ORAL
  Filled 2018-07-11: qty 1

## 2018-07-11 MED ORDER — HYDROCODONE-ACETAMINOPHEN 5-325 MG PO TABS: 1.0000 | ORAL_TABLET | Freq: Four times a day (QID) | ORAL | 0 refills | Status: DC | PRN

## 2018-07-11 MED ORDER — CEPHALEXIN 500 MG PO CAPS: 500.0000 mg | ORAL_CAPSULE | Freq: Two times a day (BID) | ORAL | 0 refills | Status: AC

## 2018-07-11 NOTE — ED Triage Notes (Signed)
Was seen here 4 days ago for abscess on neck.  Pt sts she was given abx and told that if it did not improve to come back.  Has been taking the abx for 3 days and the pain is worse.  Pain now with movement of arms.

## 2018-07-11 NOTE — ED Provider Notes (Signed)
[facility] EMERGENCY DEPARTMENT Provider Note   CSN: [id number] Arrival date & time: 07/11/18  1647    History   Chief Complaint Chief Complaint  Patient presents with  . Abscess    HPI Laura Kramer is a 43 y.o. female.     HPI Patient was seen on 7/5 for right-sided posterior neck abscess.  Had CT which demonstrated this.  Was offered I&D at the time which she declined.  She has been on Keflex and Bactrim since that time and states that the pain and swelling have gotten worse.  She denies any fever or chills.  No spontaneous drainage. Past Medical History:  Diagnosis Date  . Bronchitis    back in 09/2014, inhaler as needed  . Migraine   . Migraines     Patient Active Problem List   Diagnosis Date Noted  . Preeclampsia, severe, third trimester 12/21/2016  . HTN complicating peripregnancy, antepartum, second trimester 12/19/2016  . Gallstones 10/27/2014    Past Surgical History:  Procedure Laterality Date  . CESAREAN SECTION    . CESAREAN SECTION WITH BILATERAL TUBAL LIGATION N/A 12/21/2016   Procedure: CESAREAN SECTION WITH BILATERAL TUBAL LIGATION;  Surgeon: Paula Compton, MD;  Location: [facility];  Service: Obstetrics;  Laterality: N/A;  . CHOLECYSTECTOMY N/A 12/01/2014   Procedure: LAPAROSCOPIC CHOLECYSTECTOMY WITH INTRAOPERATIVE CHOLANGIOGRAM;  Surgeon: Jackolyn Confer, MD;  Location: [facility];  Service: General;  Laterality: N/A;  . HERNIA REPAIR    . TOOTH EXTRACTION  2008   1 tooth     OB History    Gravida  4   Para  3   Term  2   Preterm  1   AB  1   Living  3     SAB  1   TAB      Ectopic      Multiple  0   Live Births  2            Home Medications    Prior to Admission medications   Medication Sig Start Date End Date Taking? Authorizing Provider  acetaminophen (TYLENOL) 500 MG tablet Take 1,000 mg by mouth every 6 (six) hours as needed for moderate pain.   Yes [provider]  omeprazole  (PRILOSEC) 40 MG capsule omeprazole 40 mg capsule,delayed release 07/02/17  Yes [provider]  phentermine (ADIPEX-P) 37.5 MG tablet phentermine 37.5 mg tablet  TK 1 T PO QAM 08/06/17  Yes [provider]  cephALEXin (KEFLEX) 500 MG capsule Take 1 capsule (500 mg total) by mouth 2 (two) times daily for 4 days. 07/11/18 07/15/18  Julianne Rice, MD  cyclobenzaprine (FLEXERIL) 10 MG tablet cyclobenzaprine 10 mg tablet  Take 1 tablet 3 times a day, orally.    [provider]  HYDROcodone-acetaminophen (NORCO) 5-325 MG tablet Take 1-2 tablets by mouth every 6 (six) hours as needed for severe pain. 07/11/18   Julianne Rice, MD  ibuprofen (ADVIL,MOTRIN) 600 MG tablet Take 1 tablet (600 mg total) by mouth every 6 (six) hours. 12/25/16   [name], Sherren Mocha, MD  labetalol (NORMODYNE) 100 MG tablet Take 1 tablet (100 mg total) by mouth 2 (two) times daily. Patient not taking: Reported on 12/19/2016 12/18/16   Dannielle Huh, DO  labetalol (NORMODYNE) 200 MG tablet Take 2 tablets (400 mg total) by mouth 2 (two) times daily. 12/25/16   [name], Sherren Mocha, MD  metFORMIN (GLUCOPHAGE) 500 MG tablet Take 1 tablet (500 mg total) by mouth 2 (two) times daily  with a meal. 12/25/16   [name], Sherren Mocha, MD  methocarbamol (ROBAXIN) 500 MG tablet Take 1 tablet (500 mg total) by mouth 2 (two) times daily. 09/20/17   Kendrick, Caitlyn S, PA-C  oxyCODONE (OXY IR/ROXICODONE) 5 MG immediate release tablet Take 1 tablet (5 mg total) by mouth every 6 (six) hours as needed for severe pain. 12/25/16   [name], Sherren Mocha, MD  promethazine (PHENERGAN) 25 MG tablet TK 1 T PO Q 6 H PRN 10/26/16   [provider]  sulfamethoxazole-trimethoprim (BACTRIM DS) 800-160 MG tablet Take 1 tablet by mouth 2 (two) times daily for 4 days. 07/11/18 07/15/18  Julianne Rice, MD  traZODone (DESYREL) 50 MG tablet TK 1 T PO HS 07/01/18   [provider]    Family History Family History  Problem Relation Age of Onset  .  Prostate cancer Father   . Diabetes Father   . Heart disease Father   . Hypertension Father   . Diabetes Mother   . Heart disease Mother   . Hypertension Mother     Social History Social History   Tobacco Use  . Smoking status: Never Smoker  . Smokeless tobacco: Never Used  Substance Use Topics  . Alcohol use: No  . Drug use: No     Allergies   Procardia [nifedipine], Ciprofloxacin, and Clindamycin/lincomycin   Review of Systems Review of Systems  Constitutional: Negative for chills and fever.  Gastrointestinal: Negative for vomiting.  Musculoskeletal: Positive for neck pain.  Neurological: Negative for weakness, numbness and headaches.  All other systems reviewed and are negative.    Physical Exam Updated Vital Signs BP (!) 142/95 (BP Location: Left Arm)   Pulse (!) 101   Temp 98.5 F (36.9 C) (Oral)   Resp 16   Ht 5\' 3"  (1.6 m)   Wt 108.9 kg   LMP 06/04/2018   SpO2 100%   BMI 42.51 kg/m   Physical Exam Vitals signs and nursing note reviewed.  Constitutional:      Appearance: She is well-developed.  HENT:     Head: Normocephalic and atraumatic.  Eyes:     Pupils: Pupils are equal, round, and reactive to light.  Neck:     Musculoskeletal: Normal range of motion and neck supple. Muscular tenderness present. No neck rigidity.     Comments: Patient with large roughly 6 cm mass to the right posterior neck with  fluctuance.  Tender to palpation. Cardiovascular:     Rate and Rhythm: Normal rate and regular rhythm.  Pulmonary:     Effort: Pulmonary effort is normal.     Breath sounds: Normal breath sounds.  Abdominal:     General: Bowel sounds are normal.     Palpations: Abdomen is soft.     Tenderness: There is no abdominal tenderness. There is no guarding or rebound.  Musculoskeletal: Normal range of motion.        General: No tenderness.  Skin:    General: Skin is warm and dry.     Findings: No erythema or rash.  Neurological:     General: No  focal deficit present.     Mental Status: She is alert and oriented to person, place, and time.  Psychiatric:        Behavior: Behavior normal.      ED Treatments / Results  Labs (all labs ordered are listed, but only abnormal results are displayed) Labs Reviewed - No data to display  EKG None  Radiology No results found.6  Procedures .Marland KitchenIncision and Drainage  Date/Time: 07/11/2018 6:37 PM Performed by: Julianne Rice, MD Authorized by: Julianne Rice, MD   Consent:    Consent obtained:  Verbal Location:    Type:  Abscess   Size:  6   Location:  Neck   Neck location:  R posterior Pre-procedure details:    Skin preparation:  Chloraprep Anesthesia (see MAR for exact dosages):    Anesthesia method:  Topical application and local infiltration   Topical anesthetic:  EMLA cream   Local anesthetic:  Lidocaine 1% WITH epi Procedure type:    Complexity:  Complex Procedure details:    Incision types:  Stab incision   Incision depth:  Dermal   Scalpel blade:  11   Wound management:  Probed and deloculated   Drainage:  Purulent   Drainage amount:  Copious   Wound treatment:  Wound left open   Packing materials:  None Post-procedure details:    Patient tolerance of procedure:  Tolerated with difficulty   (including critical care time)  Medications Ordered in ED Medications  lidocaine (LMX) 4 % cream (has no administration in time range)  lidocaine-prilocaine (EMLA) cream (has no administration in time range)  lidocaine-EPINEPHrine (XYLOCAINE W/EPI) 1 %-1:100000 (with pres) injection 10 mL (has no administration in time range)  HYDROcodone-acetaminophen (NORCO/VICODIN) 5-325 MG per tablet 1 tablet (1 tablet Oral Given 07/11/18 1833)     Initial Impression / Assessment and Plan / ED Course  I have reviewed the triage vital signs and the nursing notes.  Pertinent labs & imaging results that were available during my care of the patient were reviewed by me and  considered in my medical decision making (see chart for details).        Patient presents with worsening posterior neck abscess.  Will perform I&D in the emergency department. Copious amount of purulent discharge.  Abscess was deloculated as patient was able to tolerate.  We will continue her antibiotics for another week and she understands the need to return in 2 days to have the wound rechecked. Final Clinical Impressions(s) / ED Diagnoses   Final diagnoses:  Neck abscess    ED Discharge Orders         Ordered    sulfamethoxazole-trimethoprim (BACTRIM DS) 800-160 MG tablet  2 times daily     07/11/18 1842    cephALEXin (KEFLEX) 500 MG capsule  2 times daily     07/11/18 1842    HYDROcodone-acetaminophen (NORCO) 5-325 MG tablet  Every 6 hours PRN     07/11/18 1842           Julianne Rice, MD 07/11/18 1842

## 2018-07-11 NOTE — ED Notes (Signed)
Pt verbalized understanding of dc instructions.

## 2018-11-03 DIAGNOSIS — A048 Other specified bacterial intestinal infections: Secondary | ICD-10-CM

## 2018-11-03 HISTORY — DX: Other specified bacterial intestinal infections: A04.8

## 2019-07-27 ENCOUNTER — Emergency Department (HOSPITAL_BASED_OUTPATIENT_CLINIC_OR_DEPARTMENT_OTHER)
Admission: EM | Admit: 2019-07-27 | Discharge: 2019-07-27 | Disposition: A | Discharge status: Home / Self Care | Payer: Medicaid Other | Attending: Emergency Medicine | Admitting: Emergency Medicine

## 2019-07-27 ENCOUNTER — Other Ambulatory Visit: Payer: Self-pay

## 2019-07-27 ENCOUNTER — Encounter (HOSPITAL_BASED_OUTPATIENT_CLINIC_OR_DEPARTMENT_OTHER): Payer: Self-pay | Admitting: Emergency Medicine

## 2019-07-27 DIAGNOSIS — Z5321 Procedure and treatment not carried out due to patient leaving prior to being seen by health care provider: Secondary | ICD-10-CM | POA: Insufficient documentation

## 2019-07-27 DIAGNOSIS — N939 Abnormal uterine and vaginal bleeding, unspecified: Secondary | ICD-10-CM | POA: Diagnosis not present

## 2019-07-27 DIAGNOSIS — R42 Dizziness and giddiness: Secondary | ICD-10-CM | POA: Insufficient documentation

## 2019-07-27 NOTE — ED Triage Notes (Signed)
Patient states that she has had vaginal bleeding for 2 weeks and 4 days, she states that she is on a medication that is supposed to stop the bleeding however it is not working. She repots that she is saturating a pad an hour at this point. Pt reports dizziness or feelings of lightheaded intermittently

## 2019-07-27 NOTE — ED Notes (Signed)
Called for room no answer

## 2019-09-09 ENCOUNTER — Other Ambulatory Visit: Payer: Self-pay

## 2019-09-09 ENCOUNTER — Other Ambulatory Visit (HOSPITAL_COMMUNITY)
Admission: RE | Admit: 2019-09-09 | Discharge: 2019-09-09 | Disposition: A | Discharge status: Home / Self Care | Payer: Medicaid Other | Source: Ambulatory Visit | Attending: Obstetrics and Gynecology | Admitting: Obstetrics and Gynecology

## 2019-09-09 ENCOUNTER — Encounter (HOSPITAL_BASED_OUTPATIENT_CLINIC_OR_DEPARTMENT_OTHER): Payer: Self-pay | Admitting: Obstetrics and Gynecology

## 2019-09-09 DIAGNOSIS — Z01812 Encounter for preprocedural laboratory examination: Secondary | ICD-10-CM | POA: Diagnosis not present

## 2019-09-09 DIAGNOSIS — Z20822 Contact with and (suspected) exposure to covid-19: Secondary | ICD-10-CM | POA: Insufficient documentation

## 2019-09-09 LAB — SARS CORONAVIRUS 2 (TAT 6-24 HRS): SARS Coronavirus 2: NEGATIVE

## 2019-09-09 NOTE — Progress Notes (Addendum)
Spoke w/ via phone for pre-op interview---pt Lab needs dos---- urine preg day of surgery has lab appt 09-10-2019 at 930 am for   Cbc,bmet, type and screen , ekg,          COVID test ------09-09-2019 905 Arrive at -------530 am  NPO after MN NO Solid Food.  Clear liquids from MN until---430 am then npo Medications to take morning of surgery -----talicia Diabetic medication -----none day of surgery Patient Special Instructions -----none Pre-Op special Istructions -----none Patient verbalized understanding of instructions that were given at this phone interview. Patient denies shortness of breath, chest pain, fever, cough at this phone interview.  Pt stopped phetermine 09-01-2019  Pt checks cbg 6 x day fasting cbg is 70-80

## 2019-09-10 ENCOUNTER — Encounter (HOSPITAL_COMMUNITY)
Admission: RE | Admit: 2019-09-10 | Discharge: 2019-09-10 | Disposition: A | Discharge status: Home / Self Care | Payer: Medicaid Other | Source: Ambulatory Visit | Attending: Obstetrics and Gynecology | Admitting: Obstetrics and Gynecology

## 2019-09-10 DIAGNOSIS — Z01818 Encounter for other preprocedural examination: Secondary | ICD-10-CM | POA: Insufficient documentation

## 2019-09-10 DIAGNOSIS — E119 Type 2 diabetes mellitus without complications: Secondary | ICD-10-CM | POA: Insufficient documentation

## 2019-09-10 LAB — CBC
HCT: 34 % — ABNORMAL LOW (ref 36.0–46.0)
Hemoglobin: 10.4 g/dL — ABNORMAL LOW (ref 12.0–15.0)
MCH: 22.8 pg — ABNORMAL LOW (ref 26.0–34.0)
MCHC: 30.6 g/dL (ref 30.0–36.0)
MCV: 74.4 fL — ABNORMAL LOW (ref 80.0–100.0)
Platelets: 600 10*3/uL — ABNORMAL HIGH (ref 150–400)
RBC: 4.57 MIL/uL (ref 3.87–5.11)
RDW: 16.1 % — ABNORMAL HIGH (ref 11.5–15.5)
WBC: 10.5 10*3/uL (ref 4.0–10.5)
nRBC: 0 % (ref 0.0–0.2)

## 2019-09-10 LAB — BASIC METABOLIC PANEL
Anion gap: 8 (ref 5–15)
BUN: 14 mg/dL (ref 6–20)
CO2: 27 mmol/L (ref 22–32)
Calcium: 9.3 mg/dL (ref 8.9–10.3)
Chloride: 102 mmol/L (ref 98–111)
Creatinine, Ser: 0.85 mg/dL (ref 0.44–1.00)
GFR calc Af Amer: >60 mL/min (ref >60)
GFR calc non Af Amer: >60 mL/min (ref >60)
Glucose, Bld: 399 mg/dL — ABNORMAL HIGH (ref 70–99)
Potassium: 4.3 mmol/L (ref 3.5–5.1)
Sodium: 137 mmol/L (ref 135–145)

## 2019-09-10 LAB — TYPE AND SCREEN
ABO/RH(D): B POS
Antibody Screen: NEGATIVE

## 2019-09-10 NOTE — Progress Notes (Signed)
Abnormal CBC, BMP results done today routed to Dr Willis Modena in epic.

## 2019-09-10 NOTE — H&P (Signed)
Laura Kramer is an 44 y.o. female. She was seen in March for irregular bleeding, normal exam at that time, placed on aygestin.  She has continued to have heavy, irregular bleeding.  Saline infused ultrasound on 8-13 with thickened posterior endometrium with possible polyp.  Pertinent Gynecological History: Last mammogram: normal Date: 09/2018 Last pap: normal Date: 09/2018 OB History: G4, P2113   Menstrual History: Patient's last menstrual period was 08/17/2019.    Past Medical History:  Diagnosis Date  . Abnormal uterine bleeding (AUB)   . Chronic back pain    djd t level  . Diabetes mellitus without complication ([facility])   . GERD (gastroesophageal reflux disease)   . H. pylori infection 41/2878   on talicia  . History of pre-eclampsia    took bp meds none since 2018  . Migraine   . Migraines     Past Surgical History:  Procedure Laterality Date  . [place], 2011  . CESAREAN SECTION WITH BILATERAL TUBAL LIGATION N/A 12/21/2016   Procedure: CESAREAN SECTION WITH BILATERAL TUBAL LIGATION;  Surgeon: Paula Compton, MD;  Location: [facility];  Service: Obstetrics;  Laterality: N/A;  . CHOLECYSTECTOMY N/A 12/01/2014   Procedure: LAPAROSCOPIC CHOLECYSTECTOMY WITH INTRAOPERATIVE CHOLANGIOGRAM;  Surgeon: Jackolyn Confer, MD;  Location: [facility];  Service: General;  Laterality: N/A;  . colonscopy  01/2018  . ESOPHAGOGASTRODUODENOSCOPY  nov 2020 and aug 2021  . HERNIA REPAIR  6767   umbilicial  . TOOTH EXTRACTION  2008   1 tooth    Family History  Problem Relation Age of Onset  . Prostate cancer Father   . Diabetes Father   . Heart disease Father   . Hypertension Father   . Diabetes Mother   . Heart disease Mother   . Hypertension Mother     Social History:  reports that she has never smoked. She has never used smokeless tobacco. She reports that she does not drink alcohol and does not use drugs.  Allergies:  Allergies  Allergen Reactions  .  Procardia [Nifedipine]     Swelling, itching and hives  . Ciprofloxacin Rash  . Clindamycin/Lincomycin Rash    No medications prior to admission.    Review of Systems  Respiratory: Negative.   Cardiovascular: Negative.     Height 5\' 2"  (1.575 m), weight 108.4 kg, last menstrual period 08/17/2019. Physical Exam Cardiovascular:     Rate and Rhythm: Normal rate and regular rhythm.     Heart sounds: No murmur heard.   Pulmonary:     Effort: Pulmonary effort is normal. No respiratory distress.     Breath sounds: Normal breath sounds.  Abdominal:     General: There is no distension.     Palpations: Abdomen is soft. There is no mass.     Tenderness: There is no abdominal tenderness.  Genitourinary:    General: Normal vulva.     Comments: Normal uterus, no adnexal mass Musculoskeletal:     Cervical back: Normal range of motion and neck supple.     Results for orders placed or performed during the hospital encounter of 09/10/19 (from the past 24 hour(s))  CBC     Status: Abnormal   Collection Time: 09/10/19  9:36 AM  Result Value Ref Range   WBC 10.5 4.0 - 10.5 K/uL   RBC 4.57 3.87 - 5.11 MIL/uL   Hemoglobin 10.4 (L) 12.0 - 15.0 g/dL   HCT 34.0 (L) 36 - 46 %   MCV 74.4 (L) 80.0 -  100.0 fL   MCH 22.8 (L) 26.0 - 34.0 pg   MCHC 30.6 30.0 - 36.0 g/dL   RDW 16.1 (H) 11.5 - 15.5 %   Platelets 600 (H) 150 - 400 K/uL   nRBC 0.0 0.0 - 0.2 %  Type and screen [facility]     Status: None   Collection Time: 09/10/19  9:36 AM  Result Value Ref Range   ABO/RH(D) B POS    Antibody Screen NEG    Sample Expiration 09/24/2019,2359    Extend sample reason      NO TRANSFUSIONS OR PREGNANCY IN THE PAST 3 MONTHS Performed at [facility], [place] [address], [place], [place] [address]   Basic metabolic panel     Status: Abnormal   Collection Time: 09/10/19  9:36 AM  Result Value Ref Range   Sodium 137 135 - 145 mmol/L   Potassium 4.3 3.5 - 5.1 mmol/L    Chloride 102 98 - 111 mmol/L   CO2 27 22 - 32 mmol/L   Glucose, Bld 399 (H) 70 - 99 mg/dL   BUN 14 6 - 20 mg/dL   Creatinine, Ser 0.85 0.44 - 1.00 mg/dL   Calcium 9.3 8.9 - 10.3 mg/dL   GFR calc non Af Amer >60 >60 mL/min   GFR calc Af Amer >60 >60 mL/min   Anion gap 8 5 - 15    No results found.  Assessment/Plan: AUB with possible endometrial polyp, has continued bleeding despite norethindrone.  All medical and surgical options have been discussed.  Surgical procedure, risks, chances of relieving symptoms have all been discussed.  Will admit for hysteroscopy, D&C, possible resection of polyp and Novasure endometrial ablation.  Her pre-op glucose is elevated, she has been encouraged to f/u to get this under better control ASAP  Blane Ohara Nami Strawder 09/10/2019, 5:59 PM

## 2019-09-10 NOTE — Anesthesia Preprocedure Evaluation (Addendum)
Anesthesia Evaluation   Patient awake    Reviewed: Allergy & Precautions, NPO status , Patient's Chart, lab work & pertinent test results  History of Anesthesia Complications (+) PONV  Airway Mallampati: III  TM Distance: >3 FB Neck ROM: Full    Dental no notable dental hx. (+) Teeth Intact, Dental Advisory Given   Pulmonary neg pulmonary ROS,    Pulmonary exam normal breath sounds clear to auscultation       Cardiovascular negative cardio ROS Normal cardiovascular exam Rhythm:Regular Rate:Normal     Neuro/Psych  Headaches, negative psych ROS   GI/Hepatic Neg liver ROS, GERD  ,  Endo/Other  diabetes, Well Controlled, Type 2, Oral Hypoglycemic Agents  Renal/GU negative Renal ROS     Musculoskeletal negative musculoskeletal ROS (+)   Abdominal (+) + obese,   Peds  Hematology negative hematology ROS (+)   Anesthesia Other Findings   Reproductive/Obstetrics negative OB ROS                            Anesthesia Physical Anesthesia Plan  ASA: III  Anesthesia Plan: General   Post-op Pain Management:    Induction: Intravenous  PONV Risk Score and Plan: 4 or greater and Treatment may vary due to age or medical condition, Midazolam and Ondansetron  Airway Management Planned: LMA  Additional Equipment: None  Intra-op Plan:   Post-operative Plan:   Informed Consent: I have reviewed the patients History and Physical, chart, labs and discussed the procedure including the risks, benefits and alternatives for the proposed anesthesia with the patient or authorized representative who has indicated his/her understanding and acceptance.     Dental advisory given  Plan Discussed with: Anesthesiologist and CRNA  Anesthesia Plan Comments:        Anesthesia Quick Evaluation

## 2019-09-11 ENCOUNTER — Ambulatory Visit (HOSPITAL_BASED_OUTPATIENT_CLINIC_OR_DEPARTMENT_OTHER): Payer: Medicaid Other | Admitting: Anesthesiology

## 2019-09-11 ENCOUNTER — Encounter (HOSPITAL_BASED_OUTPATIENT_CLINIC_OR_DEPARTMENT_OTHER): Payer: Self-pay | Admitting: Obstetrics and Gynecology

## 2019-09-11 ENCOUNTER — Encounter (HOSPITAL_BASED_OUTPATIENT_CLINIC_OR_DEPARTMENT_OTHER)
Admission: RE | Disposition: A | Discharge status: Home / Self Care | Payer: Self-pay | Source: Home / Self Care | Attending: Obstetrics and Gynecology

## 2019-09-11 ENCOUNTER — Ambulatory Visit (HOSPITAL_BASED_OUTPATIENT_CLINIC_OR_DEPARTMENT_OTHER)
Admission: RE | Admit: 2019-09-11 | Discharge: 2019-09-11 | Disposition: A | Discharge status: Home / Self Care | Payer: Medicaid Other | Attending: Obstetrics and Gynecology | Admitting: Obstetrics and Gynecology

## 2019-09-11 DIAGNOSIS — Z888 Allergy status to other drugs, medicaments and biological substances status: Secondary | ICD-10-CM | POA: Diagnosis not present

## 2019-09-11 DIAGNOSIS — E119 Type 2 diabetes mellitus without complications: Secondary | ICD-10-CM | POA: Insufficient documentation

## 2019-09-11 DIAGNOSIS — Z833 Family history of diabetes mellitus: Secondary | ICD-10-CM | POA: Insufficient documentation

## 2019-09-11 DIAGNOSIS — N921 Excessive and frequent menstruation with irregular cycle: Secondary | ICD-10-CM | POA: Insufficient documentation

## 2019-09-11 DIAGNOSIS — N840 Polyp of corpus uteri: Secondary | ICD-10-CM | POA: Diagnosis not present

## 2019-09-11 DIAGNOSIS — Z881 Allergy status to other antibiotic agents status: Secondary | ICD-10-CM | POA: Diagnosis not present

## 2019-09-11 HISTORY — PX: DILITATION & CURRETTAGE/HYSTROSCOPY WITH NOVASURE ABLATION: SHX5568

## 2019-09-11 HISTORY — DX: Personal history of other complications of pregnancy, childbirth and the puerperium: Z87.59

## 2019-09-11 HISTORY — DX: Other chronic pain: G89.29

## 2019-09-11 HISTORY — DX: Abnormal uterine and vaginal bleeding, unspecified: N93.9

## 2019-09-11 HISTORY — DX: Gastro-esophageal reflux disease without esophagitis: K21.9

## 2019-09-11 LAB — GLUCOSE, CAPILLARY
Glucose-Capillary: 204 mg/dL — ABNORMAL HIGH (ref 70–99)
Glucose-Capillary: 197 mg/dL — ABNORMAL HIGH (ref 70–99)

## 2019-09-11 LAB — POCT PREGNANCY, URINE: Preg Test, Ur: NEGATIVE

## 2019-09-11 SURGERY — DILATATION & CURETTAGE/HYSTEROSCOPY WITH NOVASURE ABLATION
Anesthesia: General | Site: Vagina

## 2019-09-11 MED ORDER — ONDANSETRON HCL 4 MG/2ML IJ SOLN: 4.0000 mg | Freq: Once | INTRAMUSCULAR | Status: DC | PRN

## 2019-09-11 MED ORDER — SCOPOLAMINE 1 MG/3DAYS TD PT72
1.0000 | MEDICATED_PATCH | TRANSDERMAL | Status: DC
  Administered 2019-09-11: 1.5 mg via TRANSDERMAL

## 2019-09-11 MED ORDER — LACTATED RINGERS IV SOLN: INTRAVENOUS | Status: DC

## 2019-09-11 MED ORDER — MIDAZOLAM HCL 2 MG/2ML IJ SOLN
INTRAMUSCULAR | Status: DC | PRN
  Administered 2019-09-11: 2 mg via INTRAVENOUS

## 2019-09-11 MED ORDER — KETOROLAC TROMETHAMINE 30 MG/ML IJ SOLN
INTRAMUSCULAR | Status: DC | PRN
  Administered 2019-09-11: 30 mg via INTRAVENOUS

## 2019-09-11 MED ORDER — DEXAMETHASONE SODIUM PHOSPHATE 10 MG/ML IJ SOLN
INTRAMUSCULAR | Status: DC | PRN
  Administered 2019-09-11: 10 mg via INTRAVENOUS

## 2019-09-11 MED ORDER — HYDROMORPHONE HCL 1 MG/ML IJ SOLN
0.2500 mg | INTRAMUSCULAR | Status: DC | PRN
  Administered 2019-09-11 (×4): 0.5 mg via INTRAVENOUS

## 2019-09-11 MED ORDER — ONDANSETRON HCL 4 MG/2ML IJ SOLN
INTRAMUSCULAR | Status: DC | PRN
  Administered 2019-09-11: 4 mg via INTRAVENOUS

## 2019-09-11 MED ORDER — FENTANYL CITRATE (PF) 100 MCG/2ML IJ SOLN
INTRAMUSCULAR | Status: AC
  Filled 2019-09-11: qty 2

## 2019-09-11 MED ORDER — DIPHENHYDRAMINE HCL 50 MG/ML IJ SOLN
12.5000 mg | Freq: Once | INTRAMUSCULAR | Status: AC
  Administered 2019-09-11: 12.5 mg via INTRAVENOUS

## 2019-09-11 MED ORDER — HYDROMORPHONE HCL 1 MG/ML IJ SOLN
INTRAMUSCULAR | Status: AC
  Filled 2019-09-11: qty 1

## 2019-09-11 MED ORDER — LIDOCAINE HCL (CARDIAC) PF 100 MG/5ML IV SOSY
PREFILLED_SYRINGE | INTRAVENOUS | Status: DC | PRN
  Administered 2019-09-11: 100 mg via INTRAVENOUS

## 2019-09-11 MED ORDER — KETOROLAC TROMETHAMINE 30 MG/ML IJ SOLN
INTRAMUSCULAR | Status: AC
  Filled 2019-09-11: qty 1

## 2019-09-11 MED ORDER — LIDOCAINE HCL 2 % IJ SOLN
INTRAMUSCULAR | Status: DC | PRN
  Administered 2019-09-11: 16 mL

## 2019-09-11 MED ORDER — OXYCODONE HCL 5 MG/5ML PO SOLN: 5.0000 mg | Freq: Once | ORAL | Status: DC | PRN

## 2019-09-11 MED ORDER — KETOROLAC TROMETHAMINE 30 MG/ML IJ SOLN: 30.0000 mg | Freq: Once | INTRAMUSCULAR | Status: DC | PRN

## 2019-09-11 MED ORDER — DEXAMETHASONE SODIUM PHOSPHATE 10 MG/ML IJ SOLN
INTRAMUSCULAR | Status: AC
  Filled 2019-09-11: qty 1

## 2019-09-11 MED ORDER — MIDAZOLAM HCL 2 MG/2ML IJ SOLN
INTRAMUSCULAR | Status: AC
  Filled 2019-09-11: qty 2

## 2019-09-11 MED ORDER — POVIDONE-IODINE 10 % EX SWAB: 2.0000 "application " | Freq: Once | CUTANEOUS | Status: DC

## 2019-09-11 MED ORDER — LIDOCAINE 2% (20 MG/ML) 5 ML SYRINGE
INTRAMUSCULAR | Status: AC
  Filled 2019-09-11: qty 5

## 2019-09-11 MED ORDER — SCOPOLAMINE 1 MG/3DAYS TD PT72
MEDICATED_PATCH | TRANSDERMAL | Status: AC
  Filled 2019-09-11: qty 1

## 2019-09-11 MED ORDER — ONDANSETRON HCL 4 MG/2ML IJ SOLN
INTRAMUSCULAR | Status: AC
  Filled 2019-09-11: qty 2

## 2019-09-11 MED ORDER — DIPHENHYDRAMINE HCL 50 MG/ML IJ SOLN
INTRAMUSCULAR | Status: AC
  Filled 2019-09-11: qty 1

## 2019-09-11 MED ORDER — OXYCODONE HCL 5 MG PO TABS: 5.0000 mg | ORAL_TABLET | Freq: Once | ORAL | Status: DC | PRN

## 2019-09-11 MED ORDER — PROPOFOL 10 MG/ML IV BOLUS
INTRAVENOUS | Status: DC | PRN
  Administered 2019-09-11: 200 mg via INTRAVENOUS

## 2019-09-11 MED ORDER — AMISULPRIDE (ANTIEMETIC) 5 MG/2ML IV SOLN: 10.0000 mg | Freq: Once | INTRAVENOUS | Status: DC | PRN

## 2019-09-11 MED ORDER — PROPOFOL 10 MG/ML IV BOLUS
INTRAVENOUS | Status: AC
  Filled 2019-09-11: qty 40

## 2019-09-11 MED ORDER — HYDROCODONE-ACETAMINOPHEN 5-325 MG PO TABS: 1.0000 | ORAL_TABLET | Freq: Four times a day (QID) | ORAL | 0 refills | Status: DC | PRN

## 2019-09-11 MED ORDER — FENTANYL CITRATE (PF) 100 MCG/2ML IJ SOLN
INTRAMUSCULAR | Status: DC | PRN
Start: 2019-09-11 — End: 2019-09-11
  Administered 2019-09-11: 50 ug via INTRAVENOUS
  Administered 2019-09-11 (×2): 25 ug via INTRAVENOUS

## 2019-09-11 MED ORDER — SODIUM CHLORIDE 0.9 % IR SOLN
Status: DC | PRN
  Administered 2019-09-11: 3000 mL

## 2019-09-11 SURGICAL SUPPLY — 35 items
ABLATOR SURESOUND NOVASURE (ABLATOR) ×3 IMPLANT
BIPOLAR CUTTING LOOP 21FR (ELECTRODE)
CANISTER SUCT 3000ML PPV (MISCELLANEOUS) ×8 IMPLANT
CATH ROBINSON RED A/P 16FR (CATHETERS) ×4 IMPLANT
COVER WAND RF STERILE (DRAPES) ×4 IMPLANT
DEVICE MYOSURE LITE (MISCELLANEOUS) ×3 IMPLANT
DEVICE MYOSURE REACH (MISCELLANEOUS) IMPLANT
DILATOR CANAL MILEX (MISCELLANEOUS) IMPLANT
ELECT REM PT RETURN 9FT ADLT (ELECTROSURGICAL)
ELECTRODE REM PT RTRN 9FT ADLT (ELECTROSURGICAL) IMPLANT
GAUZE 4X4 16PLY RFD (DISPOSABLE) ×4 IMPLANT
GLOVE BIO SURGEON STRL SZ7.5 (GLOVE) ×6 IMPLANT
GLOVE BIO SURGEON STRL SZ8 (GLOVE) ×4 IMPLANT
GLOVE BIOGEL PI IND STRL 7.5 (GLOVE) ×3 IMPLANT
GLOVE BIOGEL PI INDICATOR 7.5 (GLOVE) ×6
GLOVE ORTHO TXT STRL SZ7.5 (GLOVE) ×4 IMPLANT
GOWN STRL REUS W/ TWL XL LVL3 (GOWN DISPOSABLE) ×2 IMPLANT
GOWN STRL REUS W/TWL LRG LVL3 (GOWN DISPOSABLE) ×3 IMPLANT
GOWN STRL REUS W/TWL XL LVL3 (GOWN DISPOSABLE) ×8 IMPLANT
IV NS 1000ML (IV SOLUTION) ×4
IV NS 1000ML BAXH (IV SOLUTION) ×2 IMPLANT
IV NS IRRIG 3000ML ARTHROMATIC (IV SOLUTION) ×8 IMPLANT
KIT PROCEDURE FLUENT (KITS) ×4 IMPLANT
KIT TURNOVER CYSTO (KITS) ×4 IMPLANT
LOOP CUTTING BIPOLAR 21FR (ELECTRODE) IMPLANT
MYOSURE XL FIBROID (MISCELLANEOUS)
PACK VAGINAL MINOR WOMEN LF (CUSTOM PROCEDURE TRAY) ×4 IMPLANT
PAD OB MATERNITY 4.3X12.25 (PERSONAL CARE ITEMS) ×4 IMPLANT
PAD PREP 24X48 CUFFED NSTRL (MISCELLANEOUS) ×4 IMPLANT
SEAL CERVICAL OMNI LOK (ABLATOR) IMPLANT
SEAL ROD LENS SCOPE MYOSURE (ABLATOR) ×4 IMPLANT
SET IRRIG Y TYPE TUR BLADDER L (SET/KITS/TRAYS/PACK) ×4 IMPLANT
SYSTEM TISS REMOVAL MYOSURE XL (MISCELLANEOUS) IMPLANT
TOWEL OR 17X26 10 PK STRL BLUE (TOWEL DISPOSABLE) ×8 IMPLANT
WATER STERILE IRR 500ML POUR (IV SOLUTION) ×4 IMPLANT

## 2019-09-11 NOTE — Anesthesia Procedure Notes (Signed)
Procedure Name: LMA Insertion Date/Time: 09/11/2019 7:29 AM Performed by: Raenette Rover, CRNA Pre-anesthesia Checklist: Patient identified, Emergency Drugs available, Suction available and Patient being monitored Patient Re-evaluated:Patient Re-evaluated prior to induction Oxygen Delivery Method: Circle system utilized Preoxygenation: Pre-oxygenation with 100% oxygen Induction Type: IV induction LMA: LMA with gastric port inserted LMA Size: 4.0 Number of attempts: 1 Placement Confirmation: positive ETCO2 and breath sounds checked- equal and bilateral Tube secured with: Tape Dental Injury: Teeth and Oropharynx as per pre-operative assessment

## 2019-09-11 NOTE — Anesthesia Postprocedure Evaluation (Signed)
Anesthesia Post Note  Patient: Laura Kramer  Procedure(s) Performed: DILATATION & CURETTAGE/HYSTEROSCOPY WITH MYOSURE ,NOVASURE ABLATION (N/A Vagina )     Patient location during evaluation: PACU Anesthesia Type: General Level of consciousness: awake and alert Pain management: pain level controlled Vital Signs Assessment: post-procedure vital signs reviewed and stable Respiratory status: spontaneous breathing, nonlabored ventilation, respiratory function stable and patient connected to nasal cannula oxygen Cardiovascular status: blood pressure returned to baseline and stable Postop Assessment: no apparent nausea or vomiting Anesthetic complications: no   No complications documented.  Last Vitals:  Vitals:   09/11/19 0900 09/11/19 0954  BP: (!) 153/91 (!) 146/89  Pulse: 80 94  Resp: 11 12  Temp:    SpO2: 94% 98%    Last Pain:  Vitals:   09/11/19 0954  TempSrc:   PainSc: 3                  Barnet Glasgow

## 2019-09-11 NOTE — Interval H&P Note (Signed)
History and Physical Interval Note:  09/11/2019 7:15 AM  Laura Kramer  has presented today for surgery, with the diagnosis of abnormal uterine bleeding.  The various methods of treatment have been discussed with the patient and family. After consideration of risks, benefits and other options for treatment, the patient has consented to  Procedure(s) with comments: [place] (N/A) [place] (N/A) - possbile as a surgical intervention.  The patient's history has been reviewed, patient examined, no change in status, stable for surgery.  I have reviewed the patient's chart and labs.  Questions were answered to the patient's satisfaction.     Blane Ohara Manish Ruggiero

## 2019-09-11 NOTE — Op Note (Signed)
Preoperative diagnosis: Menorrhagia/AUB Postoperative diagnosis: Menorrhagia/AUB Procedure: Hysteroscopy, Myosure resection of endometrium, NovaSure endometrial ablation Surgeon: Cheri Fowler M.D. Anesthesia: Gen, deep paracervical block Findings: She had a normal endometrial cavity with thickened posterior endometrium. The NovaSure device used to a depth of 5.5 cm, a width of 2.5 cm and used 76 W for 1 minutes. Fluid deficit to the hysteroscope was 160 cc. Estimated blood loss: Minimal Specimens: None Complications: None  Procedure in detail: The patient was taken to the operating room and placed in the dorsosupine position. General anesthesia was induced and she was placed in mobile stirrups. Perineum and vagina were prepped and draped in usual sterile fashion and bladder drained with a red Robinson catheter. A Graves speculum was inserted into the vagina and the anterior lip of the cervix was grasped with a single-tooth tenaculum. The paracervical block was then performed with a total of 16 cc 1% lidocaine. Uterus then sounded to 10cm. Cervix was dilated to size 25 dilator. The Myosure hysteroscope was inserted and good visualization was achieved. The endometrial cavity was normal except for thickened endometrium posteriorly.  The Myosure Lite device was inserted and this endometrium was resected.  This further revealed a normal endometrial cavity. The hysteroscope was removed. The cervix was further dilated to a size 7 and size 8 Hegar dilator measuring the cervix a 4 cm. The NovaSure device was inserted and deployed properly, but only to 2.2 cm. The CO2 test passed. Endometrial ablation was performed with the above-mentioned settings without difficulty. The device was then allowed to cool for about 30 seconds and was removed. Hysteroscopy was then performed which revealed poor ablation of the fundus, good ablation of lower uterus. Hysteroscope and fluid were then removed. The single-tooth tenaculum  was removed from the cervix. Bleeding was controlled with pressure. All instruments were then removed from the vagina. The patient tolerated the procedure well and was taken to the recovery in stable condition. Counts were correct, she received no antibiotics, she had PAS hose on throughout the procedure.

## 2019-09-11 NOTE — Transfer of Care (Signed)
Immediate Anesthesia Transfer of Care Note  Patient: Laura Kramer  Procedure(s) Performed: DILATATION & CURETTAGE/HYSTEROSCOPY WITH MYOSURE ,NOVASURE ABLATION (N/A Vagina )  Patient Location: PACU  Anesthesia Type:General  Level of Consciousness: awake, alert , oriented and patient cooperative  Airway & Oxygen Therapy: Patient Spontanous Breathing and Patient connected to nasal cannula oxygen  Post-op Assessment: Report given to RN and Post -op Vital signs reviewed and stable  Post vital signs: Reviewed and stable--will make MDA aware of BP  Last Vitals:  Vitals Value Taken Time  BP 159/112 09/11/19 0815  Temp 36.8 C 09/11/19 0815  Pulse 90 09/11/19 0818  Resp 14 09/11/19 0818  SpO2 100 % 09/11/19 0818  Vitals shown include unvalidated device data.  Last Pain:  Vitals:   09/11/19 0657  TempSrc: Oral  PainSc: 0-No pain      Patients Stated Pain Goal: 5 (92/44/62 8638)  Complications: No complications documented.

## 2019-09-11 NOTE — Discharge Instructions (Signed)
Routine instructions for hysteroscopy, D&C,endometrial ablation    Post Anesthesia Home Care Instructions  Activity: Get plenty of rest for the remainder of the day. A responsible individual must stay with you for 24 hours following the procedure.  For the next 24 hours, DO NOT: -Drive a car -Paediatric nurse -Drink alcoholic beverages -Take any medication unless instructed by your physician -Make any legal decisions or sign important papers.  Meals: Start with liquid foods such as gelatin or soup. Progress to regular foods as tolerated. Avoid greasy, spicy, heavy foods. If nausea and/or vomiting occur, drink only clear liquids until the nausea and/or vomiting subsides. Call your physician if vomiting continues.  Special Instructions/Symptoms: Your throat may feel dry or sore from the anesthesia or the breathing tube placed in your throat during surgery. If this causes discomfort, gargle with warm salt water. The discomfort should disappear within 24 hours.  If you had a scopolamine patch placed behind your ear for the management of post- operative nausea and/or vomiting:  1. The medication in the patch is effective for 72 hours, after which it should be removed.  Wrap patch in a tissue and discard in the trash. Wash hands thoroughly with soap and water. 2. You may remove the patch earlier than 72 hours if you experience unpleasant side effects which may include dry mouth, dizziness or visual disturbances. 3. Avoid touching the patch. Wash your hands with soap and water after contact with the patch.  Remove dressing behind right ear by Sunday, September 14, 2019.  DISCHARGE INSTRUCTIONS: HYSTEROSCOPY / ENDOMETRIAL ABLATION The following instructions have been prepared to help you care for yourself upon your return home.  May take Ibuprofen after 2 PM.  May take stool softner while taking narcotic pain medication to prevent constipation.  Drink plenty of water.  Personal  hygiene: Marland Kitchen Use sanitary pads for vaginal drainage, not tampons. . Shower the day after your procedure. . NO tub baths, pools or Jacuzzis for 2-3 weeks. . Wipe front to back after using the bathroom.  Activity and limitations: . Do NOT drive or operate any equipment for 24 hours. The effects of anesthesia are still present and drowsiness may result. . Do NOT rest in bed all day. . Walking is encouraged. . Walk up and down stairs slowly. . You may resume your normal activity in one to two days or as indicated by your physician. Sexual activity: NO intercourse for at least 2 weeks after the procedure, or as indicated by your Doctor.  Diet: Eat a light meal as desired this evening. You may resume your usual diet tomorrow.  Return to Work: You may resume your work activities in one to two days or as indicated by Marine scientist.  What to expect after your surgery: Expect to have vaginal bleeding/discharge for 2-3 days and spotting for up to 10 days. It is not unusual to have soreness for up to 1-2 weeks. You may have a slight burning sensation when you urinate for the first day. Mild cramps may continue for a couple of days. You may have a regular period in 2-6 weeks.  Call your doctor for any of the following: . Excessive vaginal bleeding or clotting, saturating and changing one pad every hour. . Inability to urinate 6 hours after discharge from hospital. . Pain not relieved by pain medication. . Fever of 100.4 F or greater. . Unusual vaginal discharge or odor.

## 2019-09-12 ENCOUNTER — Encounter (HOSPITAL_BASED_OUTPATIENT_CLINIC_OR_DEPARTMENT_OTHER): Payer: Self-pay | Admitting: Obstetrics and Gynecology

## 2019-09-12 LAB — SURGICAL PATHOLOGY

## 2020-06-02 DIAGNOSIS — M75100 Unspecified rotator cuff tear or rupture of unspecified shoulder, not specified as traumatic: Secondary | ICD-10-CM

## 2020-06-02 HISTORY — DX: Unspecified rotator cuff tear or rupture of unspecified shoulder, not specified as traumatic: M75.100

## 2020-06-02 HISTORY — PX: OTHER SURGICAL HISTORY: SHX169

## 2020-06-02 HISTORY — PX: PARTIAL HYSTERECTOMY: SHX80

## 2020-07-02 DIAGNOSIS — J189 Pneumonia, unspecified organism: Secondary | ICD-10-CM

## 2020-07-02 HISTORY — DX: Pneumonia, unspecified organism: J18.9

## 2021-04-01 ENCOUNTER — Emergency Department (HOSPITAL_BASED_OUTPATIENT_CLINIC_OR_DEPARTMENT_OTHER): Payer: Medicaid Other

## 2021-04-01 ENCOUNTER — Encounter (HOSPITAL_BASED_OUTPATIENT_CLINIC_OR_DEPARTMENT_OTHER): Payer: Self-pay

## 2021-04-01 ENCOUNTER — Other Ambulatory Visit: Payer: Self-pay

## 2021-04-01 ENCOUNTER — Emergency Department (HOSPITAL_BASED_OUTPATIENT_CLINIC_OR_DEPARTMENT_OTHER)
Admission: EM | Admit: 2021-04-01 | Discharge: 2021-04-02 | Disposition: A | Discharge status: Home / Self Care | Payer: Medicaid Other | Attending: Emergency Medicine | Admitting: Emergency Medicine

## 2021-04-01 DIAGNOSIS — R0789 Other chest pain: Secondary | ICD-10-CM | POA: Diagnosis present

## 2021-04-01 LAB — CBC
HCT: 38.5 % (ref 36.0–46.0)
Hemoglobin: 12.8 g/dL (ref 12.0–15.0)
MCH: 26.6 pg (ref 26.0–34.0)
MCHC: 33.2 g/dL (ref 30.0–36.0)
MCV: 80 fL (ref 80.0–100.0)
Platelets: 475 10*3/uL — ABNORMAL HIGH (ref 150–400)
RBC: 4.81 MIL/uL (ref 3.87–5.11)
RDW: 14.1 % (ref 11.5–15.5)
WBC: 11.8 10*3/uL — ABNORMAL HIGH (ref 4.0–10.5)
nRBC: 0 % (ref 0.0–0.2)

## 2021-04-01 LAB — PREGNANCY, URINE: Preg Test, Ur: NEGATIVE

## 2021-04-01 NOTE — ED Notes (Signed)
To X-ray

## 2021-04-01 NOTE — ED Triage Notes (Signed)
Patient arrived POV for chest pain that started at lunch time today. Pain is intermittent and patient began feeling weak and nauseous. Chest pain begins at the left side and radiates to the back of her shoulder. History of diabetes.  ?

## 2021-04-01 NOTE — ED Provider Notes (Signed)
?[facility]PART[facility] ?Provider Note ? ? ?CSN: [id number] ?Arrival date & time: 04/01/21  2306 ? ?  ? ?History ? ?Chief Complaint  ?Patient presents with  ? Chest Pain  ? ? ?Laura Kramer is a 46 y.o. female. ? ?Patient presents to the emergency department for evaluation of chest pain.  Patient started having pains in the left side of her chest that goes straight through into her shoulder blade around noon today.  Episodes occur sporadically.  Sometimes it is very brief, sometimes it lasts longer.  She has not identified anything that causes the pain.  She has noticed that when it is there it seems to hurt to move and she tries to stay very still until the pain passes.  She has had some associated nausea.  No vomiting, diarrhea, abdominal pain. ? ? ?  ? ?Home Medications ?Prior to Admission medications   ?Medication Sig Start Date End Date Taking? Authorizing Provider  ?acetaminophen (TYLENOL) 500 MG tablet Take 1,000 mg by mouth every 6 (six) hours as needed for moderate pain.    [provider]  ?Amoxicill-Rifabutin-Omeprazole (TALICIA PO) Take by mouth. 4 pills every 8 hours    [provider]  ?glipiZIDE (GLUCOTROL XL) 10 MG 24 hr tablet Take 10 mg by mouth daily with breakfast.    [provider]  ?HYDROcodone-acetaminophen (NORCO/VICODIN) 5-325 MG tablet Take 1 tablet by mouth every 6 (six) hours as needed for severe pain. 09/11/19   [name], Sherren Mocha, MD  ?phentermine (ADIPEX-P) 37.5 MG tablet phentermine 37.5 mg tablet ? TK 1 T PO QAM 08/06/17   [provider]  ?rizatriptan (MAXALT-MLT) 10 MG disintegrating tablet Take 10 mg by mouth as needed for migraine. May repeat in 2 hours if needed    [provider]  ?   ? ?Allergies    ?Procardia [nifedipine], Ciprofloxacin, and Clindamycin/lincomycin   ? ?Review of Systems   ?Review of Systems  ?Cardiovascular:  Positive for chest pain.  ?Musculoskeletal:  Positive for back pain.  ? ?Physical  Exam ?Updated Vital Signs ?BP (!) 136/98   Pulse 78   Temp 98 ?F (36.7 ?C) (Oral)   Resp 16   Ht '5\' 3"'$  (1.6 m)   Wt 103.9 kg   LMP 03/21/2021 (Exact Date)   SpO2 100%   BMI 40.57 kg/m?  ?Physical Exam ?Vitals and nursing note reviewed.  ?Constitutional:   ?   General: She is not in acute distress. ?   Appearance: She is well-developed.  ?HENT:  ?   Head: Normocephalic and atraumatic.  ?   Mouth/Throat:  ?   Mouth: Mucous membranes are moist.  ?Eyes:  ?   General: Vision grossly intact. Gaze aligned appropriately.  ?   Extraocular Movements: Extraocular movements intact.  ?   Conjunctiva/sclera: Conjunctivae normal.  ?Cardiovascular:  ?   Rate and Rhythm: Normal rate and regular rhythm.  ?   Pulses: Normal pulses.  ?   Heart sounds: Normal heart sounds, S1 normal and S2 normal. No murmur heard. ?  No friction rub. No gallop.  ?Pulmonary:  ?   Effort: Pulmonary effort is normal. No respiratory distress.  ?   Breath sounds: Normal breath sounds.  ?Abdominal:  ?   General: Bowel sounds are normal.  ?   Palpations: Abdomen is soft.  ?   Tenderness: There is no abdominal tenderness. There is no guarding or rebound.  ?   Hernia: No hernia is present.  ?Musculoskeletal:     ?  General: No swelling.  ?   Cervical back: Full passive range of motion without pain, normal range of motion and neck supple. No spinous process tenderness or muscular tenderness. Normal range of motion.  ?   Right lower leg: No edema.  ?   Left lower leg: No edema.  ?Skin: ?   General: Skin is warm and dry.  ?   Capillary Refill: Capillary refill takes less than 2 seconds.  ?   Findings: No ecchymosis, erythema, rash or wound.  ?Neurological:  ?   General: No focal deficit present.  ?   Mental Status: She is alert and oriented to person, place, and time.  ?   GCS: GCS eye subscore is 4. GCS verbal subscore is 5. GCS motor subscore is 6.  ?   Cranial Nerves: Cranial nerves 2-12 are intact.  ?   Sensory: Sensation is intact.  ?   Motor: Motor  function is intact.  ?   Coordination: Coordination is intact.  ?Psychiatric:     ?   Attention and Perception: Attention normal.     ?   Mood and Affect: Mood normal.     ?   Speech: Speech normal.     ?   Behavior: Behavior normal.  ? ? ?ED Results / Procedures / Treatments   ?Labs ?(all labs ordered are listed, but only abnormal results are displayed) ?Labs Reviewed  ?BASIC METABOLIC PANEL - Abnormal; Notable for the following components:  ?    Result Value  ? Glucose, Bld 139 (*)   ? All other components within normal limits  ?CBC - Abnormal; Notable for the following components:  ? WBC 11.8 (*)   ? Platelets 475 (*)   ? All other components within normal limits  ?HEPATIC FUNCTION PANEL - Abnormal; Notable for the following components:  ? Total Bilirubin <0.1 (*)   ? Bilirubin, Direct 0.3 (*)   ? All other components within normal limits  ?PREGNANCY, URINE  ?LIPASE, BLOOD  ?TROPONIN I (HIGH SENSITIVITY)  ?TROPONIN I (HIGH SENSITIVITY)  ? ? ?EKG ?EKG Interpretation ? ?Date/Time:  Friday April 01 2021 23:19:05 EDT ?Ventricular Rate:  86 ?PR Interval:  174 ?QRS Duration: 96 ?QT Interval:  377 ?QTC Calculation: 451 ?R Axis:   90 ?Text Interpretation: Sinus rhythm Borderline right axis deviation Confirmed by Orpah Greek [phone]) on 04/01/2021 11:23:23 PM ? ?Radiology ?DG Chest 2 View ? ?Result Date: 04/01/2021 ?CLINICAL DATA:  Left chest pain. EXAM: CHEST - 2 VIEW COMPARISON:  None. FINDINGS: The heart size and mediastinal contours are within normal limits. Both lungs are clear. The visualized skeletal structures are unremarkable. IMPRESSION: No active cardiopulmonary disease. Electronically Signed   By: Ronney Asters M.D.   On: 04/01/2021 23:47  ? ?CT Angio Chest Pulmonary Embolism (PE) W or WO Contrast ? ?Result Date: 04/02/2021 ?CLINICAL DATA:  Pulmonary embolus suspected with high probability. EXAM: CT ANGIOGRAPHY CHEST WITH CONTRAST TECHNIQUE: Multidetector CT imaging of the chest was performed using the  standard protocol during bolus administration of intravenous contrast. Multiplanar CT image reconstructions and MIPs were obtained to evaluate the vascular anatomy. RADIATION DOSE REDUCTION: This exam was performed according to the departmental dose-optimization program which includes automated exposure control, adjustment of the mA and/or kV according to patient size and/or use of iterative reconstruction technique. CONTRAST:  152m OMNIPAQUE IOHEXOL 350 MG/ML SOLN COMPARISON:  07/10/2020 FINDINGS: Cardiovascular: Good opacification of the central and segmental pulmonary arteries. No focal filling defects identified. No evidence  of significant pulmonary embolus. Normal heart size. No pericardial effusions. Normal caliber thoracic aorta. No aortic dissection. Great vessel origins are patent. Mediastinum/Nodes: No enlarged mediastinal, hilar, or axillary lymph nodes. Thyroid gland, trachea, and esophagus demonstrate no significant findings. Lungs/Pleura: Lungs are clear. No pleural effusion or pneumothorax. Upper Abdomen: Diffuse fatty infiltration of the liver. Surgical absence of the gallbladder. Musculoskeletal: No chest wall abnormality. No acute or significant osseous findings. Review of the MIP images confirms the above findings. IMPRESSION: 1. No evidence of significant pulmonary embolus. 2. No evidence of active pulmonary disease. 3. Fatty infiltration of the liver. Electronically Signed   By: Lucienne Capers M.D.   On: 04/02/2021 02:37   ? ?Procedures ?Procedures  ? ? ?Medications Ordered in ED ?Medications  ?sodium chloride 0.9 % bolus 500 mL (0 mLs Intravenous Stopped 04/02/21 0324)  ?ketorolac (TORADOL) 30 MG/ML injection 15 mg (15 mg Intravenous Given 04/02/21 0234)  ?iohexol (OMNIPAQUE) 350 MG/ML injection 100 mL (100 mLs Intravenous Contrast Given 04/02/21 0221)  ? ? ?ED Course/ Medical Decision Making/ A&P ?  ?                        ?Medical Decision Making ?Amount and/or Complexity of Data  Reviewed ?Labs: ordered. ?Radiology: ordered. ? ?Risk ?Prescription drug management. ? ? ?Patient presents to the emergency department for evaluation of chest pain.  Patient reports that the pain has been present for most of today.

## 2021-04-02 ENCOUNTER — Emergency Department (HOSPITAL_BASED_OUTPATIENT_CLINIC_OR_DEPARTMENT_OTHER): Payer: Medicaid Other

## 2021-04-02 ENCOUNTER — Encounter (HOSPITAL_BASED_OUTPATIENT_CLINIC_OR_DEPARTMENT_OTHER): Payer: Self-pay | Admitting: Radiology

## 2021-04-02 LAB — LIPASE, BLOOD: Lipase: 43 U/L (ref 11–51)

## 2021-04-02 LAB — HEPATIC FUNCTION PANEL
ALT: 26 U/L (ref 0–44)
AST: 21 U/L (ref 15–41)
Albumin: 4.2 g/dL (ref 3.5–5.0)
Alkaline Phosphatase: 78 U/L (ref 38–126)
Bilirubin, Direct: 0.3 mg/dL — ABNORMAL HIGH (ref 0.0–0.2)
Total Bilirubin: <0.1 mg/dL — ABNORMAL LOW (ref 0.3–1.2)
Total Protein: 7.6 g/dL (ref 6.5–8.1)

## 2021-04-02 LAB — BASIC METABOLIC PANEL
Anion gap: 8 (ref 5–15)
BUN: 10 mg/dL (ref 6–20)
CO2: 29 mmol/L (ref 22–32)
Calcium: 9.1 mg/dL (ref 8.9–10.3)
Chloride: 103 mmol/L (ref 98–111)
Creatinine, Ser: 0.86 mg/dL (ref 0.44–1.00)
GFR, Estimated: >60 mL/min (ref >60)
Glucose, Bld: 139 mg/dL — ABNORMAL HIGH (ref 70–99)
Potassium: 3.8 mmol/L (ref 3.5–5.1)
Sodium: 140 mmol/L (ref 135–145)

## 2021-04-02 LAB — TROPONIN I (HIGH SENSITIVITY)
Troponin I (High Sensitivity): 2 ng/L (ref <18)
Troponin I (High Sensitivity): 2 ng/L (ref <18)

## 2021-04-02 MED ORDER — SODIUM CHLORIDE 0.9 % IV BOLUS
500.0000 mL | Freq: Once | INTRAVENOUS | Status: AC
  Administered 2021-04-02: 500 mL via INTRAVENOUS

## 2021-04-02 MED ORDER — KETOROLAC TROMETHAMINE 30 MG/ML IJ SOLN
15.0000 mg | Freq: Once | INTRAMUSCULAR | Status: AC
  Administered 2021-04-02: 15 mg via INTRAVENOUS
  Filled 2021-04-02: qty 1

## 2021-04-02 MED ORDER — IOHEXOL 350 MG/ML SOLN
100.0000 mL | Freq: Once | INTRAVENOUS | Status: AC | PRN
  Administered 2021-04-02: 100 mL via INTRAVENOUS

## 2021-04-02 NOTE — ED Notes (Signed)
Patient transported to CT 

## 2021-06-03 ENCOUNTER — Encounter (HOSPITAL_BASED_OUTPATIENT_CLINIC_OR_DEPARTMENT_OTHER): Payer: Self-pay | Admitting: Obstetrics and Gynecology

## 2021-06-03 ENCOUNTER — Other Ambulatory Visit: Payer: Self-pay

## 2021-06-03 NOTE — Progress Notes (Signed)
Your procedure is scheduled on Thursday, 06/16/2021  Report to [facility].   Call this number if you have problems the morning of surgery  :[phone].   OUR ADDRESS IS [place].  WE ARE LOCATED IN [facility].  PLEASE BRING YOUR INSURANCE CARD AND PHOTO ID DAY OF SURGERY.  ONLY 2 PEOPLE ARE ALLOWED IN  WAITING  ROOM.                                      REMEMBER:  DO NOT EAT FOOD, CANDY GUM OR MINTS  AFTER MIDNIGHT THE NIGHT BEFORE YOUR SURGERY . YOU MAY HAVE CLEAR LIQUIDS FROM MIDNIGHT THE NIGHT BEFORE YOUR SURGERY UNTIL  6:15 AM. NO CLEAR LIQUIDS AFTER  6:15 AM DAY OF SURGERY.  YOU MAY  BRUSH YOUR TEETH MORNING OF SURGERY AND RINSE YOUR MOUTH OUT, NO CHEWING GUM CANDY OR MINTS.     CLEAR LIQUID DIET   Foods Allowed                                                                     Foods Excluded  Coffee and tea, regular and decaf                             liquids that you cannot  Plain Jell-O                                                                   see through such as: Fruit ices (not with fruit pulp)                                     milk, soups, orange juice  Plain  Popsicles                                    All solid food Carbonated beverages, regular and diet                                    Cranberry, grape and apple juices Sports drinks like Gatorade _____________________________________________________________________     TAKE THESE MEDICATIONS MORNING OF SURGERY: Maxalt if needed Stop taking phentermine as of our phone call on 06/03/2021. Do not take any diabetic medications the morning of surgery,  Glipizide, Farxiga, & Ozempic the morning of surgery.    UP TO 4 VISITORS  MAY VISIT IN THE EXTENDED RECOVERY ROOM UNTIL 800 PM ONLY.  1 VISITOR AGE 22 AND OVER MAY SPEND THE NIGHT AND MUST BE IN EXTENDED RECOVERY ROOM NO LATER THAN 800 PM . YOUR DISCHARGE  TIME AFTER YOU SPEND THE NIGHT IS  900 AM THE MORNING AFTER YOUR SURGERY.  YOU MAY PACK A SMALL OVERNIGHT BAG WITH TOILETRIES FOR YOUR OVERNIGHT STAY IF YOU WISH.  YOUR PRESCRIPTION MEDICATIONS WILL BE PROVIDED DURING [facility].                                      DO NOT WEAR JEWERLY, MAKE UP. DO NOT WEAR LOTIONS, POWDERS, PERFUMES OR NAIL POLISH ON YOUR FINGERNAILS. TOENAIL POLISH IS OK TO WEAR. DO NOT SHAVE FOR 48 HOURS PRIOR TO DAY OF SURGERY. MEN MAY SHAVE FACE AND NECK. CONTACTS, GLASSES, OR DENTURES MAY NOT BE WORN TO SURGERY.  REMEMBER: NO SMOKING, DRUGS OR ALCOHOL FOR 24 HOURS BEFORE YOUR SURGERY.                                    [facility] IS NOT RESPONSIBLE  FOR ANY BELONGINGS.                                                                    Marland Kitchen           [facility] - Preparing for Surgery Before surgery, you can play an important role.  Because skin is not sterile, your skin needs to be as free of germs as possible.  You can reduce the number of germs on your skin by washing with CHG (chlorahexidine gluconate) soap before surgery.  CHG is an antiseptic cleaner which kills germs and bonds with the skin to continue killing germs even after washing. Please DO NOT use if you have an allergy to CHG or antibacterial soaps.  If your skin becomes reddened/irritated stop using the CHG and inform your nurse when you arrive at Short Stay. Do not shave (including legs and underarms) for at least 48 hours prior to the first CHG shower.  You may shave your face/neck. Please follow these instructions carefully:  1.  Shower with CHG Soap the night before surgery and the  morning of Surgery.  2.  If you choose to wash your hair, wash your hair first as usual with your  normal  shampoo.  3.  After you shampoo, rinse your hair and body thoroughly to remove the  shampoo.                            4.  Use CHG as you would any other liquid soap.  You can apply chg directly  to the skin and wash , please wash your belly  button thoroughly with chg soap provided night before and morning of your surgery.                     Gently with a scrungie or clean washcloth.  5.  Apply the CHG Soap to your body ONLY FROM THE NECK DOWN.   Do not use on face/ open  Wound or open sores. Avoid contact with eyes, ears mouth and genitals (private parts).                       Wash face,  Genitals (private parts) with your normal soap.             6.  Wash thoroughly, paying special attention to the area where your surgery  will be performed.  7.  Thoroughly rinse your body with warm water from the neck down.  8.  DO NOT shower/wash with your normal soap after using and rinsing off  the CHG Soap.                9.  Pat yourself dry with a clean towel.            10.  Wear clean pajamas.            11.  Place clean sheets on your bed the night of your first shower and do not  sleep with pets. Day of Surgery : Do not apply any lotions/deodorants the morning of surgery.  Please wear clean clothes to the hospital/surgery center.  IF YOU HAVE ANY SKIN IRRITATION OR PROBLEMS WITH THE SURGICAL SOAP, PLEASE GET A BAR OF GOLD DIAL SOAP AND SHOWER THE NIGHT BEFORE YOUR SURGERY AND THE MORNING OF YOUR SURGERY. PLEASE LET THE NURSE KNOW MORNING OF YOUR SURGERY IF YOU HAD ANY PROBLEMS WITH THE SURGICAL SOAP.   ________________________________________________________________________                                                        QUESTIONS [facility] PRE OP NURSE PHONE [phone].

## 2021-06-03 NOTE — Progress Notes (Addendum)
Spoke w/ via phone for pre-op interview---Laura Kramer needs dos----urine pregnancy               Kramer results------06/13/21 CBC, CMP, type & screen, 04/01/21 EKG in chart & Epic, 04/01/21 chest xray in Epic, 04/02/21 Chest CT COVID test -----patient states asymptomatic no test needed Arrive at -------0715 on Thursday, 06/16/21 NPO after MN NO Solid Food.  Clear liquids from MN until---0615 Med rec completed Medications to take morning of surgery -----Maxalt if needed, stop phentermine now Diabetic medication -----Hold Glipizide, Farxiga, & Ozempic the morning of surgery. Patient instructed no nail polish to be worn day of surgery Patient instructed to bring photo id and insurance card day of surgery Patient aware to have Driver (ride ) / caregiver    for 24 hours after surgery - sister, Deloris Patient Special Instructions -----Extended / overnight stay instructions given. Pre-Op special Istructions -----none Patient verbalized understanding of instructions that were given at this phone interview. Patient denies shortness of breath, chest pain, fever, cough at this phone interview.

## 2021-06-13 ENCOUNTER — Encounter (HOSPITAL_COMMUNITY)
Admission: RE | Admit: 2021-06-13 | Discharge: 2021-06-13 | Disposition: A | Discharge status: Home / Self Care | Payer: Medicaid Other | Source: Ambulatory Visit | Attending: Obstetrics and Gynecology | Admitting: Obstetrics and Gynecology

## 2021-06-13 DIAGNOSIS — Z01812 Encounter for preprocedural laboratory examination: Secondary | ICD-10-CM | POA: Insufficient documentation

## 2021-06-13 DIAGNOSIS — N946 Dysmenorrhea, unspecified: Secondary | ICD-10-CM | POA: Insufficient documentation

## 2021-06-13 LAB — COMPREHENSIVE METABOLIC PANEL
ALT: 21 U/L (ref 0–44)
AST: 19 U/L (ref 15–41)
Albumin: 4.2 g/dL (ref 3.5–5.0)
Alkaline Phosphatase: 75 U/L (ref 38–126)
Anion gap: 7 (ref 5–15)
BUN: 13 mg/dL (ref 6–20)
CO2: 25 mmol/L (ref 22–32)
Calcium: 9.6 mg/dL (ref 8.9–10.3)
Chloride: 109 mmol/L (ref 98–111)
Creatinine, Ser: 0.73 mg/dL (ref 0.44–1.00)
GFR, Estimated: >60 mL/min (ref >60)
Glucose, Bld: 141 mg/dL — ABNORMAL HIGH (ref 70–99)
Potassium: 4 mmol/L (ref 3.5–5.1)
Sodium: 141 mmol/L (ref 135–145)
Total Bilirubin: 0.2 mg/dL — ABNORMAL LOW (ref 0.3–1.2)
Total Protein: 7.2 g/dL (ref 6.5–8.1)

## 2021-06-13 LAB — CBC
HCT: 40.4 % (ref 36.0–46.0)
Hemoglobin: 13.2 g/dL (ref 12.0–15.0)
MCH: 27.2 pg (ref 26.0–34.0)
MCHC: 32.7 g/dL (ref 30.0–36.0)
MCV: 83.3 fL (ref 80.0–100.0)
Platelets: 526 10*3/uL — ABNORMAL HIGH (ref 150–400)
RBC: 4.85 MIL/uL (ref 3.87–5.11)
RDW: 14.1 % (ref 11.5–15.5)
WBC: 9.4 10*3/uL (ref 4.0–10.5)
nRBC: 0 % (ref 0.0–0.2)

## 2021-06-15 NOTE — H&P (Signed)
Laura Kramer is an 46 y.o. female. She was seen for annual exam in March.  Previous Novasure endometrial ablation in 2021.  On POP for continued irregular bleeding and pain with menses.  She is ready for definitive treatment  Pertinent Gynecological History: Last mammogram: normal Date: 04/2021 Last pap: normal Date: 2020 OB History: G4, P2113 3 c-sections   Menstrual History: Patient's last menstrual period was 05/22/2021.    Past Medical History:  Diagnosis Date   Abnormal uterine bleeding (AUB)    Chronic back pain    djd t level   Diabetes mellitus without complication ([place])    type 2   GERD (gastroesophageal reflux disease)    H. pylori infection 53/9767   on talicia   History of pre-eclampsia    took bp meds none since 2018   Migraine    Pneumonia 07/2020   after surgery on shoulder on 06/28/20   Torn rotator cuff 06/2020   left   Wears contact lenses     Past Surgical History:  Procedure Laterality Date   CESAREAN SECTION  1994, 2011   [place] N/A 12/21/2016   Procedure: CESAREAN SECTION WITH BILATERAL TUBAL LIGATION;  Surgeon: Paula Compton, MD;  Location: [facility];  Service: Obstetrics;  Laterality: N/A;   CHOLECYSTECTOMY N/A 12/01/2014   Procedure: LAPAROSCOPIC CHOLECYSTECTOMY WITH INTRAOPERATIVE CHOLANGIOGRAM;  Surgeon: Jackolyn Confer, MD;  Location: [facility];  Service: General;  Laterality: N/A;   colonscopy  01/2018   DILITATION & CURRETTAGE/HYSTROSCOPY WITH NOVASURE ABLATION N/A 09/11/2019   Procedure: DILATATION & CURETTAGE/HYSTEROSCOPY WITH MYOSURE ,NOVASURE ABLATION;  Surgeon: Cheri Fowler, MD;  Location: [facility];  Service: Gynecology;  Laterality: N/A;   ESOPHAGOGASTRODUODENOSCOPY  nov 2020 and aug 2021   HERNIA REPAIR  3419   umbilicial   rotator cuff surgery Left 06/2020   TOOTH EXTRACTION  2008   1 tooth    Family History  Problem Relation Age of Onset   Prostate  cancer Father    Diabetes Father    Heart disease Father    Hypertension Father    Diabetes Mother    Heart disease Mother    Hypertension Mother     Social History:  reports that she has never smoked. She has never used smokeless tobacco. She reports that she does not drink alcohol and does not use drugs.  Allergies:  Allergies  Allergen Reactions   Procardia [Nifedipine]     Swelling, itching and hives   Ciprofloxacin Rash   Clindamycin/Lincomycin Rash    No medications prior to admission.    Review of Systems  Respiratory: Negative.    Cardiovascular: Negative.     Weight 99.3 kg, last menstrual period 05/22/2021. Physical Exam Constitutional:      Appearance: She is obese.  Cardiovascular:     Rate and Rhythm: Normal rate and regular rhythm.     Heart sounds: Normal heart sounds. No murmur heard. Pulmonary:     Effort: Pulmonary effort is normal. No respiratory distress.     Breath sounds: Normal breath sounds.  Abdominal:     General: There is no distension.     Palpations: Abdomen is soft. There is no mass.     Tenderness: There is no abdominal tenderness.  Genitourinary:    General: Normal vulva.     Rectum: Normal.     Comments: Normal uterus No adnexal mass Musculoskeletal:     Cervical back: Normal range of motion and neck supple.  Neurological:     Mental Status: She is alert.     No results found for this or any previous visit (from the past 24 hour(s)).  No results found.  Assessment/Plan: Persistent irregular bleeding and dysmenorrhea, s/p Novasure 2 years ago.  All medical and surgical options have been discussed, she is ready for definitive surgical treatment.  Surgical procedure, risks, alternatives, chances of relieving her symptoms have all been discussed, questions answered.  Will admit for TLH, bilateral salpingectomy, cystoscopy.  Blane Ohara Alena Blankenbeckler 06/15/2021, 7:33 PM

## 2021-06-15 NOTE — Anesthesia Preprocedure Evaluation (Addendum)
Anesthesia Evaluation  Patient identified by MRN, date of birth, ID band Patient awake    Reviewed: Allergy & Precautions, NPO status , Patient's Chart, lab work & pertinent test results  History of Anesthesia Complications (+) PONV and history of anesthetic complications  Airway Mallampati: II  TM Distance: >3 FB Neck ROM: Full    Dental no notable dental hx. (+) Teeth Intact   Pulmonary    Pulmonary exam normal breath sounds clear to auscultation       Cardiovascular Normal cardiovascular exam Rhythm:Regular Rate:Normal     Neuro/Psych  Headaches,    GI/Hepatic GERD  ,  Endo/Other  diabetesMorbid obesity (BMI 39)  Renal/GU      Musculoskeletal negative musculoskeletal ROS (+)   Abdominal (+) + obese,   Peds  Hematology   Anesthesia Other Findings All: Procardia, Cipro, Clindamycin  Reproductive/Obstetrics                            Anesthesia Physical Anesthesia Plan  ASA: 2  Anesthesia Plan: General   Post-op Pain Management: Lidocaine infusion*, Toradol IV (intra-op)* and Ketamine IV*   Induction: Intravenous  PONV Risk Score and Plan: 4 or greater and Scopolamine patch - Pre-op, Midazolam, Dexamethasone, Ondansetron and Treatment may vary due to age or medical condition  Airway Management Planned: Oral ETT  Additional Equipment: None  Intra-op Plan:   Post-operative Plan: Extubation in OR  Informed Consent: I have reviewed the patients History and Physical, chart, labs and discussed the procedure including the risks, benefits and alternatives for the proposed anesthesia with the patient or authorized representative who has indicated his/her understanding and acceptance.     Dental advisory given  Plan Discussed with: CRNA and Anesthesiologist  Anesthesia Plan Comments:        Anesthesia Quick Evaluation

## 2021-06-16 ENCOUNTER — Ambulatory Visit (HOSPITAL_BASED_OUTPATIENT_CLINIC_OR_DEPARTMENT_OTHER)
Admission: RE | Admit: 2021-06-16 | Discharge: 2021-06-16 | Disposition: A | Discharge status: Home / Self Care | Payer: Medicaid Other | Attending: Obstetrics and Gynecology | Admitting: Obstetrics and Gynecology

## 2021-06-16 ENCOUNTER — Ambulatory Visit (HOSPITAL_BASED_OUTPATIENT_CLINIC_OR_DEPARTMENT_OTHER): Payer: Medicaid Other | Admitting: Anesthesiology

## 2021-06-16 ENCOUNTER — Encounter (HOSPITAL_BASED_OUTPATIENT_CLINIC_OR_DEPARTMENT_OTHER): Payer: Self-pay | Admitting: Obstetrics and Gynecology

## 2021-06-16 ENCOUNTER — Other Ambulatory Visit: Payer: Self-pay

## 2021-06-16 ENCOUNTER — Encounter (HOSPITAL_BASED_OUTPATIENT_CLINIC_OR_DEPARTMENT_OTHER)
Admission: RE | Disposition: A | Discharge status: Home / Self Care | Payer: Self-pay | Source: Home / Self Care | Attending: Obstetrics and Gynecology

## 2021-06-16 DIAGNOSIS — Z6839 Body mass index (BMI) 39.0-39.9, adult: Secondary | ICD-10-CM | POA: Insufficient documentation

## 2021-06-16 DIAGNOSIS — N736 Female pelvic peritoneal adhesions (postinfective): Secondary | ICD-10-CM | POA: Diagnosis not present

## 2021-06-16 DIAGNOSIS — Z01818 Encounter for other preprocedural examination: Secondary | ICD-10-CM

## 2021-06-16 DIAGNOSIS — Z90710 Acquired absence of both cervix and uterus: Secondary | ICD-10-CM | POA: Diagnosis present

## 2021-06-16 DIAGNOSIS — D259 Leiomyoma of uterus, unspecified: Secondary | ICD-10-CM | POA: Insufficient documentation

## 2021-06-16 DIAGNOSIS — E119 Type 2 diabetes mellitus without complications: Secondary | ICD-10-CM | POA: Diagnosis not present

## 2021-06-16 DIAGNOSIS — N8003 Adenomyosis of the uterus: Secondary | ICD-10-CM | POA: Diagnosis not present

## 2021-06-16 DIAGNOSIS — N949 Unspecified condition associated with female genital organs and menstrual cycle: Secondary | ICD-10-CM

## 2021-06-16 DIAGNOSIS — N946 Dysmenorrhea, unspecified: Secondary | ICD-10-CM | POA: Insufficient documentation

## 2021-06-16 HISTORY — DX: Presence of spectacles and contact lenses: Z97.3

## 2021-06-16 HISTORY — PX: CYSTOSCOPY: SHX5120

## 2021-06-16 HISTORY — PX: TOTAL LAPAROSCOPIC HYSTERECTOMY WITH SALPINGECTOMY: SHX6742

## 2021-06-16 LAB — GLUCOSE, CAPILLARY
Glucose-Capillary: 201 mg/dL — ABNORMAL HIGH (ref 70–99)
Glucose-Capillary: 248 mg/dL — ABNORMAL HIGH (ref 70–99)

## 2021-06-16 LAB — TYPE AND SCREEN
ABO/RH(D): B POS
Antibody Screen: NEGATIVE

## 2021-06-16 LAB — POCT PREGNANCY, URINE: Preg Test, Ur: NEGATIVE

## 2021-06-16 SURGERY — HYSTERECTOMY, TOTAL, LAPAROSCOPIC, WITH SALPINGECTOMY
Anesthesia: General | Site: Bladder | Laterality: Bilateral

## 2021-06-16 MED ORDER — GABAPENTIN 300 MG PO CAPS
300.0000 mg | ORAL_CAPSULE | ORAL | Status: AC
  Administered 2021-06-16: 300 mg via ORAL

## 2021-06-16 MED ORDER — OXYCODONE HCL 5 MG PO TABS
5.0000 mg | ORAL_TABLET | ORAL | Status: DC | PRN
  Administered 2021-06-16: 5 mg via ORAL

## 2021-06-16 MED ORDER — ACETAMINOPHEN 500 MG PO TABS
ORAL_TABLET | ORAL | Status: AC
  Filled 2021-06-16: qty 2

## 2021-06-16 MED ORDER — ALUM & MAG HYDROXIDE-SIMETH 200-200-20 MG/5ML PO SUSP: 30.0000 mL | ORAL | Status: DC | PRN

## 2021-06-16 MED ORDER — BUPIVACAINE HCL (PF) 0.25 % IJ SOLN
INTRAMUSCULAR | Status: DC | PRN
  Administered 2021-06-16: 19 mL

## 2021-06-16 MED ORDER — OXYCODONE HCL 5 MG PO TABS: 5.0000 mg | ORAL_TABLET | Freq: Once | ORAL | Status: DC | PRN

## 2021-06-16 MED ORDER — KETAMINE HCL 10 MG/ML IJ SOLN
INTRAMUSCULAR | Status: DC | PRN
  Administered 2021-06-16: 10 mg via INTRAVENOUS
  Administered 2021-06-16: 30 mg via INTRAVENOUS

## 2021-06-16 MED ORDER — LIDOCAINE 2% (20 MG/ML) 5 ML SYRINGE
INTRAMUSCULAR | Status: DC | PRN
  Administered 2021-06-16: 60 mg via INTRAVENOUS
  Administered 2021-06-16: 1.5 mg/kg/h via INTRAVENOUS

## 2021-06-16 MED ORDER — KETOROLAC TROMETHAMINE 30 MG/ML IJ SOLN
INTRAMUSCULAR | Status: AC
  Filled 2021-06-16: qty 1

## 2021-06-16 MED ORDER — OXYCODONE HCL 5 MG PO TABS: 5.0000 mg | ORAL_TABLET | ORAL | 0 refills | Status: DC | PRN

## 2021-06-16 MED ORDER — CEFAZOLIN SODIUM-DEXTROSE 2-4 GM/100ML-% IV SOLN
INTRAVENOUS | Status: AC
  Filled 2021-06-16: qty 100

## 2021-06-16 MED ORDER — SCOPOLAMINE 1 MG/3DAYS TD PT72: 1.0000 | MEDICATED_PATCH | TRANSDERMAL | Status: DC

## 2021-06-16 MED ORDER — ONDANSETRON HCL 4 MG/2ML IJ SOLN
INTRAMUSCULAR | Status: AC
  Filled 2021-06-16: qty 2

## 2021-06-16 MED ORDER — KETAMINE HCL 50 MG/5ML IJ SOSY
PREFILLED_SYRINGE | INTRAMUSCULAR | Status: AC
  Filled 2021-06-16: qty 5

## 2021-06-16 MED ORDER — LIDOCAINE HCL (PF) 2 % IJ SOLN
INTRAMUSCULAR | Status: AC
  Filled 2021-06-16: qty 10

## 2021-06-16 MED ORDER — KETOROLAC TROMETHAMINE 30 MG/ML IJ SOLN
INTRAMUSCULAR | Status: DC | PRN
  Administered 2021-06-16: 30 mg via INTRAVENOUS

## 2021-06-16 MED ORDER — DEXTROSE-NACL 5-0.45 % IV SOLN
INTRAVENOUS | Status: DC
Start: 2021-06-16 — End: 2021-06-16

## 2021-06-16 MED ORDER — ONDANSETRON HCL 4 MG/2ML IJ SOLN
4.0000 mg | Freq: Once | INTRAMUSCULAR | Status: AC | PRN
  Administered 2021-06-16: 4 mg via INTRAVENOUS

## 2021-06-16 MED ORDER — SODIUM CHLORIDE 0.9 % IR SOLN
Status: DC | PRN
  Administered 2021-06-16: 500 mL

## 2021-06-16 MED ORDER — DEXAMETHASONE SODIUM PHOSPHATE 10 MG/ML IJ SOLN
INTRAMUSCULAR | Status: AC
  Filled 2021-06-16: qty 1

## 2021-06-16 MED ORDER — LACTATED RINGERS IV SOLN: INTRAVENOUS | Status: DC

## 2021-06-16 MED ORDER — BUPIVACAINE HCL (PF) 0.25 % IJ SOLN
INTRAMUSCULAR | Status: AC
  Filled 2021-06-16: qty 30

## 2021-06-16 MED ORDER — MIDAZOLAM HCL 2 MG/2ML IJ SOLN
INTRAMUSCULAR | Status: DC | PRN
  Administered 2021-06-16: 2 mg via INTRAVENOUS

## 2021-06-16 MED ORDER — BUPIVACAINE HCL 0.5 % IJ SOLN
INTRAMUSCULAR | Status: DC | PRN
  Administered 2021-06-16: 16 mL

## 2021-06-16 MED ORDER — FENTANYL CITRATE (PF) 250 MCG/5ML IJ SOLN
INTRAMUSCULAR | Status: AC
  Filled 2021-06-16: qty 5

## 2021-06-16 MED ORDER — PROPOFOL 10 MG/ML IV BOLUS
INTRAVENOUS | Status: DC | PRN
  Administered 2021-06-16: 200 mg via INTRAVENOUS

## 2021-06-16 MED ORDER — ROCURONIUM BROMIDE 10 MG/ML (PF) SYRINGE
PREFILLED_SYRINGE | INTRAVENOUS | Status: DC | PRN
  Administered 2021-06-16: 10 mg via INTRAVENOUS
  Administered 2021-06-16: 60 mg via INTRAVENOUS
  Administered 2021-06-16: 20 mg via INTRAVENOUS

## 2021-06-16 MED ORDER — SIMETHICONE 80 MG PO CHEW: 80.0000 mg | CHEWABLE_TABLET | Freq: Four times a day (QID) | ORAL | Status: DC | PRN

## 2021-06-16 MED ORDER — SCOPOLAMINE 1 MG/3DAYS TD PT72
1.0000 | MEDICATED_PATCH | TRANSDERMAL | Status: DC
  Administered 2021-06-16: 1.5 mg via TRANSDERMAL

## 2021-06-16 MED ORDER — IBUPROFEN 600 MG PO TABS: 600.0000 mg | ORAL_TABLET | Freq: Four times a day (QID) | ORAL | 0 refills | Status: AC

## 2021-06-16 MED ORDER — BUPIVACAINE HCL (PF) 0.5 % IJ SOLN
INTRAMUSCULAR | Status: AC
  Filled 2021-06-16: qty 30

## 2021-06-16 MED ORDER — INSULIN ASPART 100 UNIT/ML IJ SOLN
0.0000 [IU] | INTRAMUSCULAR | Status: DC
  Administered 2021-06-16: 7 [IU] via SUBCUTANEOUS

## 2021-06-16 MED ORDER — KETOROLAC TROMETHAMINE 30 MG/ML IJ SOLN: 30.0000 mg | Freq: Once | INTRAMUSCULAR | Status: DC | PRN

## 2021-06-16 MED ORDER — ONDANSETRON HCL 4 MG/2ML IJ SOLN: 4.0000 mg | Freq: Four times a day (QID) | INTRAMUSCULAR | Status: DC | PRN

## 2021-06-16 MED ORDER — GABAPENTIN 300 MG PO CAPS
ORAL_CAPSULE | ORAL | Status: AC
  Filled 2021-06-16: qty 1

## 2021-06-16 MED ORDER — OXYCODONE HCL 5 MG PO TABS
ORAL_TABLET | ORAL | Status: AC
  Filled 2021-06-16: qty 1

## 2021-06-16 MED ORDER — SUGAMMADEX SODIUM 200 MG/2ML IV SOLN
INTRAVENOUS | Status: DC | PRN
  Administered 2021-06-16: 200 mg via INTRAVENOUS

## 2021-06-16 MED ORDER — GABAPENTIN 300 MG PO CAPS
600.0000 mg | ORAL_CAPSULE | Freq: Three times a day (TID) | ORAL | Status: DC
  Administered 2021-06-16: 600 mg via ORAL

## 2021-06-16 MED ORDER — HYDROMORPHONE HCL 1 MG/ML IJ SOLN: 1.0000 mg | INTRAMUSCULAR | Status: DC | PRN

## 2021-06-16 MED ORDER — LIDOCAINE HCL (PF) 2 % IJ SOLN
INTRAMUSCULAR | Status: AC
  Filled 2021-06-16: qty 15

## 2021-06-16 MED ORDER — PHENYLEPHRINE 80 MCG/ML (10ML) SYRINGE FOR IV PUSH (FOR BLOOD PRESSURE SUPPORT)
PREFILLED_SYRINGE | INTRAVENOUS | Status: DC | PRN
  Administered 2021-06-16: 160 ug via INTRAVENOUS
  Administered 2021-06-16: 80 ug via INTRAVENOUS

## 2021-06-16 MED ORDER — SCOPOLAMINE 1 MG/3DAYS TD PT72
MEDICATED_PATCH | TRANSDERMAL | Status: AC
  Filled 2021-06-16: qty 1

## 2021-06-16 MED ORDER — KETOROLAC TROMETHAMINE 30 MG/ML IJ SOLN: 30.0000 mg | Freq: Four times a day (QID) | INTRAMUSCULAR | Status: DC

## 2021-06-16 MED ORDER — FENTANYL CITRATE (PF) 100 MCG/2ML IJ SOLN
INTRAMUSCULAR | Status: DC | PRN
  Administered 2021-06-16 (×3): 50 ug via INTRAVENOUS

## 2021-06-16 MED ORDER — IBUPROFEN 200 MG PO TABS
ORAL_TABLET | ORAL | Status: AC
  Filled 2021-06-16: qty 3

## 2021-06-16 MED ORDER — ACETAMINOPHEN 500 MG PO TABS
1000.0000 mg | ORAL_TABLET | ORAL | Status: AC
  Administered 2021-06-16: 1000 mg via ORAL

## 2021-06-16 MED ORDER — ONDANSETRON HCL 4 MG/2ML IJ SOLN
INTRAMUSCULAR | Status: DC | PRN
  Administered 2021-06-16: 4 mg via INTRAVENOUS

## 2021-06-16 MED ORDER — ROCURONIUM BROMIDE 10 MG/ML (PF) SYRINGE
PREFILLED_SYRINGE | INTRAVENOUS | Status: AC
  Filled 2021-06-16: qty 10

## 2021-06-16 MED ORDER — PROPOFOL 10 MG/ML IV BOLUS
INTRAVENOUS | Status: AC
  Filled 2021-06-16: qty 20

## 2021-06-16 MED ORDER — MIDAZOLAM HCL 2 MG/2ML IJ SOLN
INTRAMUSCULAR | Status: AC
  Filled 2021-06-16: qty 2

## 2021-06-16 MED ORDER — HYDROMORPHONE HCL 1 MG/ML IJ SOLN
INTRAMUSCULAR | Status: AC
  Filled 2021-06-16: qty 1

## 2021-06-16 MED ORDER — INSULIN ASPART 100 UNIT/ML IJ SOLN
INTRAMUSCULAR | Status: AC
  Filled 2021-06-16: qty 1

## 2021-06-16 MED ORDER — MENTHOL 3 MG MT LOZG: 1.0000 | LOZENGE | OROMUCOSAL | Status: DC | PRN

## 2021-06-16 MED ORDER — DEXAMETHASONE SODIUM PHOSPHATE 10 MG/ML IJ SOLN
INTRAMUSCULAR | Status: DC | PRN
  Administered 2021-06-16: 10 mg via INTRAVENOUS

## 2021-06-16 MED ORDER — HYDROMORPHONE HCL 1 MG/ML IJ SOLN
0.2500 mg | INTRAMUSCULAR | Status: DC | PRN
  Administered 2021-06-16: 0.5 mg via INTRAVENOUS
  Administered 2021-06-16: 0.25 mg via INTRAVENOUS
  Administered 2021-06-16 (×2): 0.5 mg via INTRAVENOUS
  Administered 2021-06-16: 0.25 mg via INTRAVENOUS

## 2021-06-16 MED ORDER — ONDANSETRON HCL 4 MG PO TABS: 4.0000 mg | ORAL_TABLET | Freq: Four times a day (QID) | ORAL | Status: DC | PRN

## 2021-06-16 MED ORDER — LIDOCAINE HCL (PF) 2 % IJ SOLN
INTRAMUSCULAR | Status: DC | PRN
  Administered 2021-06-16: 1.5 mg/kg/h via INTRADERMAL

## 2021-06-16 MED ORDER — ACETAMINOPHEN 500 MG PO TABS
1000.0000 mg | ORAL_TABLET | Freq: Four times a day (QID) | ORAL | Status: DC
  Administered 2021-06-16: 1000 mg via ORAL

## 2021-06-16 MED ORDER — IBUPROFEN 200 MG PO TABS
600.0000 mg | ORAL_TABLET | Freq: Four times a day (QID) | ORAL | Status: DC
  Administered 2021-06-16: 600 mg via ORAL

## 2021-06-16 MED ORDER — CEFAZOLIN SODIUM-DEXTROSE 2-4 GM/100ML-% IV SOLN
2.0000 g | INTRAVENOUS | Status: AC
  Administered 2021-06-16: 2 g via INTRAVENOUS

## 2021-06-16 MED ORDER — POVIDONE-IODINE 10 % EX SWAB: 2.0000 "application " | Freq: Once | CUTANEOUS | Status: DC

## 2021-06-16 MED ORDER — BISACODYL 10 MG RE SUPP: 10.0000 mg | Freq: Every day | RECTAL | Status: DC | PRN

## 2021-06-16 MED ORDER — OXYCODONE HCL 5 MG/5ML PO SOLN: 5.0000 mg | Freq: Once | ORAL | Status: DC | PRN

## 2021-06-16 SURGICAL SUPPLY — 56 items
ADH SKN CLS APL DERMABOND .7 (GAUZE/BANDAGES/DRESSINGS) ×2
APL SRG 38 LTWT LNG FL B (MISCELLANEOUS)
APPLICATOR ARISTA FLEXITIP XL (MISCELLANEOUS) IMPLANT
BARRIER ADHS 3X4 INTERCEED (GAUZE/BANDAGES/DRESSINGS) IMPLANT
BRR ADH 4X3 ABS CNTRL BYND (GAUZE/BANDAGES/DRESSINGS)
CABLE HIGH FREQUENCY MONO STRZ (ELECTRODE) ×1 IMPLANT
COVER BACK TABLE 60X90IN (DRAPES) IMPLANT
COVER MAYO STAND STRL (DRAPES) ×3 IMPLANT
DERMABOND ADVANCED (GAUZE/BANDAGES/DRESSINGS) ×1
DERMABOND ADVANCED .7 DNX12 (GAUZE/BANDAGES/DRESSINGS) ×2 IMPLANT
DEVICE SUTURE ENDOST 10MM (ENDOMECHANICALS) ×1 IMPLANT
DRSG OPSITE POSTOP 4X10 (GAUZE/BANDAGES/DRESSINGS) IMPLANT
DURAPREP 26ML APPLICATOR (WOUND CARE) ×3 IMPLANT
GAUZE 4X4 16PLY ~~LOC~~+RFID DBL (SPONGE) ×3 IMPLANT
GLOVE BIOGEL PI IND STRL 7.0 (GLOVE) IMPLANT
GLOVE BIOGEL PI IND STRL 8 (GLOVE) ×2 IMPLANT
GLOVE BIOGEL PI INDICATOR 7.0 (GLOVE) ×1
GLOVE BIOGEL PI INDICATOR 8 (GLOVE) ×1
GLOVE ORTHO TXT STRL SZ7.5 (GLOVE) ×7 IMPLANT
GOWN STRL REUS W/TWL LRG LVL3 (GOWN DISPOSABLE) ×4 IMPLANT
GOWN STRL REUS W/TWL XL LVL3 (GOWN DISPOSABLE) ×6 IMPLANT
HEMOSTAT ARISTA ABSORB 3G PWDR (HEMOSTASIS) IMPLANT
KIT TURNOVER CYSTO (KITS) ×3 IMPLANT
NDL SPNL 22GX3.5 QUINCKE BK (NEEDLE) ×2 IMPLANT
NEEDLE INSUFFLATION 120MM (ENDOMECHANICALS) ×3 IMPLANT
NEEDLE SPNL 22GX3.5 QUINCKE BK (NEEDLE) ×3 IMPLANT
NS IRRIG 1000ML POUR BTL (IV SOLUTION) ×3 IMPLANT
OCCLUDER COLPOPNEUMO (BALLOONS) ×3 IMPLANT
PACK LAPAROSCOPY BASIN (CUSTOM PROCEDURE TRAY) ×3 IMPLANT
PACK TRENDGUARD 450 HYBRID PRO (MISCELLANEOUS) ×2 IMPLANT
PROTECTOR NERVE ULNAR (MISCELLANEOUS) ×4 IMPLANT
SCISSORS LAP 5X35 DISP (ENDOMECHANICALS) ×1 IMPLANT
SET IRRIG Y TYPE TUR BLADDER L (SET/KITS/TRAYS/PACK) ×1 IMPLANT
SET SUCTION IRRIG HYDROSURG (IRRIGATION / IRRIGATOR) ×3 IMPLANT
SET TRI-LUMEN FLTR TB AIRSEAL (TUBING) ×3 IMPLANT
SHEARS 1100 HARMONIC 36 (ELECTROSURGICAL) ×1 IMPLANT
SUT ENDO VLOC 180-0-8IN (SUTURE) ×2 IMPLANT
SUT VIC AB 0 CT1 36 (SUTURE) ×2 IMPLANT
SUT VIC AB 4-0 PS2 27 (SUTURE) ×4 IMPLANT
SUT VICRYL 0 UR6 27IN ABS (SUTURE) ×3 IMPLANT
SUT VLOC 180 0 9IN  GS21 (SUTURE)
SUT VLOC 180 0 9IN GS21 (SUTURE) ×2 IMPLANT
SYR 10ML LL (SYRINGE) ×3 IMPLANT
SYR 50ML LL SCALE MARK (SYRINGE) ×3 IMPLANT
SYR CONTROL 10ML LL (SYRINGE) ×4 IMPLANT
TIP UTERINE 5.1X6CM LAV DISP (MISCELLANEOUS) ×1 IMPLANT
TIP UTERINE 6.7X10CM GRN DISP (MISCELLANEOUS) IMPLANT
TIP UTERINE 6.7X6CM WHT DISP (MISCELLANEOUS) IMPLANT
TIP UTERINE 6.7X8CM BLUE DISP (MISCELLANEOUS) IMPLANT
TOWEL OR 17X26 10 PK STRL BLUE (TOWEL DISPOSABLE) ×3 IMPLANT
TRAY FOLEY W/BAG SLVR 14FR LF (SET/KITS/TRAYS/PACK) ×3 IMPLANT
TRENDGUARD 450 HYBRID PRO PACK (MISCELLANEOUS) ×3
TROCAR BLADELESS OPT 5 100 (ENDOMECHANICALS) ×3 IMPLANT
TROCAR PORT AIRSEAL 5X120 (TROCAR) ×3 IMPLANT
TROCAR XCEL DIL TIP R 11M (ENDOMECHANICALS) ×3 IMPLANT
WARMER LAPAROSCOPE (MISCELLANEOUS) ×3 IMPLANT

## 2021-06-16 NOTE — Anesthesia Postprocedure Evaluation (Signed)
Anesthesia Post Note  Patient: Ashley Murrain  Procedure(s) Performed: TOTAL LAPAROSCOPIC HYSTERECTOMY WITH BILATERAL SALPINGECTOMY (Bilateral: Abdomen) CYSTOSCOPY (Bilateral: Bladder)     Patient location during evaluation: PACU Anesthesia Type: General Level of consciousness: awake and alert Pain management: pain level controlled Vital Signs Assessment: post-procedure vital signs reviewed and stable Respiratory status: spontaneous breathing, nonlabored ventilation and respiratory function stable Cardiovascular status: blood pressure returned to baseline Postop Assessment: no apparent nausea or vomiting Anesthetic complications: no   No notable events documented.  Last Vitals:  Vitals:   06/16/21 1330 06/16/21 1345  BP: (!) 150/91   Pulse: 88   Resp: 16   Temp:  37.1 C  SpO2: 97%                Marthenia Rolling

## 2021-06-16 NOTE — Brief Op Note (Signed)
06/16/2021  12:15 PM  PATIENT:  Laura Kramer  46 y.o. female  PRE-OPERATIVE DIAGNOSIS:  dysmenorrhea  POST-OPERATIVE DIAGNOSIS:  dysmenorrhea  PROCEDURE:  Procedure(s): TOTAL LAPAROSCOPIC HYSTERECTOMY WITH BILATERAL SALPINGECTOMY (Bilateral) CYSTOSCOPY (Bilateral)  SURGEON:  Surgeon(s) and Role:    * Syler Norcia, MD - Primary    * Banga, Cecilia Worema, DO - Assisting   ANESTHESIA:   general  EBL:  100    LOCAL MEDICATIONS USED:  MARCAINE     SPECIMEN:  Source of Specimen:  uterus with cervix, bilateral tubes  DISPOSITION OF SPECIMEN:  PATHOLOGY  COUNTS:  YES  TOURNIQUET:  * No tourniquets in log *  DICTATION: .Dragon Dictation  PLAN OF CARE:  OWER  PATIENT DISPOSITION:  PACU - hemodynamically stable.   Delay start of Pharmacological VTE agent (>24hrs) due to surgical blood loss or risk of bleeding: not applicable

## 2021-06-16 NOTE — Discharge Instructions (Addendum)
Routine instructions for laparoscopic hysterectomy   Post Anesthesia Home Care Instructions  Activity: Get plenty of rest for the remainder of the day. A responsible individual must stay with you for 24 hours following the procedure.  For the next 24 hours, DO NOT: -Drive a car -Paediatric nurse -Drink alcoholic beverages -Take any medication unless instructed by your physician -Make any legal decisions or sign important papers.  Meals: Start with liquid foods such as gelatin or soup. Progress to regular foods as tolerated. Avoid greasy, spicy, heavy foods. If nausea and/or vomiting occur, drink only clear liquids until the nausea and/or vomiting subsides. Call your physician if vomiting continues.  Special Instructions/Symptoms: Your throat may feel dry or sore from the anesthesia or the breathing tube placed in your throat during surgery. If this causes discomfort, gargle with warm salt water. The discomfort should disappear within 24 hours.  If you had a scopolamine patch placed behind your ear for the management of post- operative nausea and/or vomiting:  1. The medication in the patch is effective for 72 hours, after which it should be removed.  Wrap patch in a tissue and discard in the trash. Wash hands thoroughly with soap and water. 2. You may remove the patch earlier than 72 hours if you experience unpleasant side effects which may include dry mouth, dizziness or visual disturbances. 3. Avoid touching the patch. Wash your hands with soap and water after contact with the patch.     Post Anesthesia Home Care Instructions  Activity: Get plenty of rest for the remainder of the day. A responsible individual must stay with you for 24 hours following the procedure.  For the next 24 hours, DO NOT: -Drive a car -Paediatric nurse -Drink alcoholic beverages -Take any medication unless instructed by your physician -Make any legal decisions or sign important papers.  Meals: Start  with liquid foods such as gelatin or soup. Progress to regular foods as tolerated. Avoid greasy, spicy, heavy foods. If nausea and/or vomiting occur, drink only clear liquids until the nausea and/or vomiting subsides. Call your physician if vomiting continues.  Special Instructions/Symptoms: Your throat may feel dry or sore from the anesthesia or the breathing tube placed in your throat during surgery. If this causes discomfort, gargle with warm salt water. The discomfort should disappear within 24 hours.  If you had a scopolamine patch placed behind your ear for the management of post- operative nausea and/or vomiting:  1. The medication in the patch is effective for 72 hours, after which it should be removed.  Wrap patch in a tissue and discard in the trash. Wash hands thoroughly with soap and water. 2. You may remove the patch earlier than 72 hours if you experience unpleasant side effects which may include dry mouth, dizziness or visual disturbances. 3. Avoid touching the patch. Wash your hands with soap and water after contact with the patch.\

## 2021-06-16 NOTE — Transfer of Care (Signed)
Immediate Anesthesia Transfer of Care Note  Patient: Laura Kramer  Procedure(s) Performed: Procedure(s) (LRB): TOTAL LAPAROSCOPIC HYSTERECTOMY WITH BILATERAL SALPINGECTOMY (Bilateral) CYSTOSCOPY (Bilateral)  Patient Location: PACU  Anesthesia Type: General  Level of Consciousness: drowsy  Airway & Oxygen Therapy: Patient Spontanous Breathing and Patient connected to face mask oxygen, oral airway remaining.  Post-op Assessment: Report given to PACU RN and Post -op Vital signs reviewed and stable  Post vital signs: Reviewed and stable  Complications: No apparent anesthesia complications  Last Vitals:  Vitals Value Taken Time  BP 144/92 06/16/21 1237  Temp    Pulse 80 06/16/21 1242  Resp 13 06/16/21 1242  SpO2 100 % 06/16/21 1242  Vitals shown include unvalidated device data.  Last Pain:  Vitals:   06/16/21 0749  TempSrc: Oral  PainSc: 0-No pain      Patients Stated Pain Goal: 4 (33/00/76 2263)  Complications: No notable events documented.

## 2021-06-16 NOTE — Anesthesia Procedure Notes (Signed)
Procedure Name: Intubation Date/Time: 06/16/2021 9:38 AM  Performed by: Mechele Claude, CRNAPre-anesthesia Checklist: Patient identified, Emergency Drugs available, Suction available and Patient being monitored Patient Re-evaluated:Patient Re-evaluated prior to induction Oxygen Delivery Method: Circle system utilized Preoxygenation: Pre-oxygenation with 100% oxygen Induction Type: IV induction Ventilation: Mask ventilation without difficulty Laryngoscope Size: Mac and 3 Grade View: Grade I Tube type: Oral Tube size: 7.0 mm Number of attempts: 1 Airway Equipment and Method: Stylet and Oral airway Placement Confirmation: ETT inserted through vocal cords under direct vision, positive ETCO2 and breath sounds checked- equal and bilateral Secured at: 21 cm Tube secured with: Tape Dental Injury: Teeth and Oropharynx as per pre-operative assessment

## 2021-06-16 NOTE — Progress Notes (Signed)
Post-op check, s/p TLH with significant adhesiolysis Doing well, minimal pain, no n/v-tol PO, just voided and getting ready to ambulate Afeb, VSS Abd- soft, incisions intact  Meeting post-op milestones, will d/c home

## 2021-06-16 NOTE — Op Note (Signed)
Preoperative diagnosis: Persistent severe dysmenorrhea Postoperative diagnosis: Same and pelvic adhesions Procedure: Total laparoscopic hysterectomy, bilateral salpingectomy, cystoscopy, significant adhesiolysis Surgeon: Cheri Fowler M.D.  Assistant: Crawford Givens, D.O. Anesthesia: Gen. Endotracheal tube  Findings: Omentum and bowel adherent around umbilicus.  Uterus densely adherent to anterior abdominal wall, but otherwise normal.  Right tube and ovary adherent to anterior abdominal wall, evidence of previous BTL, normal ovaries, normal appendix. Normal bladder with patent ureters via cystoscopy.   Assistance from Dr. Terri Piedra was certainly necessary for retraction and to do her side of surgery Specimens: Uterus and tubes for routine pathology  Estimated blood loss: 161 cc  Complications: None  Procedure in detail:  The patient was taken to the operating room and placed in the dorsosupine position. General anesthesia was induced. Arms were tucked to her sides and legs were placed in mobile stirrups. Abdomen perineum and vagina were then prepped and draped in usual sterile fashion. A 23m RUMI with the small sized metal cup was then applied to the uterus and cervix for uterine manipulation and a Foley catheter was inserted. Infraumbilical skin was infiltrated with quarter percent Marcaine and a 1 cm vertical incision was made. A veress needle was attempted twice, but I was not able to confirm intraperitoneal entry.  Direct entry via the 5 mm trocar was then achieved and CO2 insufflation was initiated. A 5 mm airseal port was then also placed on the right side and a 10/11 trocar placed on the left side under direct visualization. Inspection revealed the above-mentioned findings. Bowel adhesions to anterior abdominal wall were taken down sharply with cold scissors to aid in vusualization.  The distal right fallopian tube was grasped with a grasper from the left side. The Harmonic scalpel Ace was used to free  the tube from the ovary, and take down the right mesosalpinx to mobilize the tube.  A similar procedure was then performed on the patient's left side taking down the mesosalpinx and freeing the tube.  I then had to methodically and carefully, using the harmonic scalpel, start taking down the uterus from the anterior abdominal wall.  This took some time.  I was eventually able to take down the round ligament, utero-ovarian pedicle, and broad ligament on the right, then on the left.  In this process, I also took down uterine vessels on each side with harmonic scalpel.  With some time and difficulty I was eventually able to remove the uterus from the anterior abdominal wall and create a plane down the anterior vagina.   I then began to detach the cervix from the vagina. The RUMI cup was pushed superiorly, the Harmonic scalpel was placed on the cup edge and a circumferential incision was made starting anteriorly into the vagina. This released the uterosacral ligaments as well as all attachments to the vagina. At this point all pedicles appeared to be hemostatic and there was no other pathology noted.  The uterus was pulled into the vagina without difficulty.  The pelvis was irrigated and found to be hemostatic.  The vaginal cuff was then closed with the Endostitch device using 0 V-lok suture with good closure and hemostasis. The pelvis was again irrigated and found to be hemostatic with good closure of the vaginal cuff.  The 10/11 port was removed under direct visualization. A 0 Vicryl suture was placed as deep as possible on the left side where the 10/11 port was placed.  Gas was evacuated and the 2 remaining trocars were removed.  Skin incisions were  closed with interrupted subcuticular sutures of 4-0 Vicryl followed by Dermabond. The uterus was then removed from the vagina.  Cystoscopy was performed with a 70 degree cystoscope after the foley catheter was removed.  Bladder was normal, both ureters were patent.   Bladder drained and cystoscope removed. The patient tolerated the procedure well. She was taken to the recovery in stable condition. Counts were correct x2, she received Ancef 2 gm IV at the beginning of the procedure and had PAS hose on throughout the procedure.

## 2021-06-16 NOTE — Interval H&P Note (Signed)
History and Physical Interval Note:  06/16/2021 8:55 AM  Laura Kramer  has presented today for surgery, with the diagnosis of dysmenorrhea.  The various methods of treatment have been discussed with the patient and family. After consideration of risks, benefits and other options for treatment, the patient has consented to  Procedure(s): TOTAL LAPAROSCOPIC HYSTERECTOMY WITH SALPINGECTOMY (Bilateral) CYSTOSCOPY (Bilateral) as a surgical intervention.  The patient's history has been reviewed, patient examined, no change in status, stable for surgery.  I have reviewed the patient's chart and labs.  Questions were answered to the patient's satisfaction.     Blane Ohara Chelby Salata

## 2021-06-17 ENCOUNTER — Encounter (HOSPITAL_BASED_OUTPATIENT_CLINIC_OR_DEPARTMENT_OTHER): Payer: Self-pay | Admitting: Obstetrics and Gynecology

## 2021-06-20 LAB — SURGICAL PATHOLOGY

## 2022-04-06 ENCOUNTER — Encounter (HOSPITAL_BASED_OUTPATIENT_CLINIC_OR_DEPARTMENT_OTHER): Payer: Self-pay | Admitting: Emergency Medicine

## 2022-04-06 ENCOUNTER — Emergency Department (HOSPITAL_BASED_OUTPATIENT_CLINIC_OR_DEPARTMENT_OTHER)
Admission: EM | Admit: 2022-04-06 | Discharge: 2022-04-06 | Disposition: A | Discharge status: Home / Self Care | Payer: Medicaid Other | Attending: Emergency Medicine | Admitting: Emergency Medicine

## 2022-04-06 ENCOUNTER — Emergency Department (HOSPITAL_BASED_OUTPATIENT_CLINIC_OR_DEPARTMENT_OTHER): Payer: Medicaid Other

## 2022-04-06 ENCOUNTER — Other Ambulatory Visit: Payer: Self-pay

## 2022-04-06 DIAGNOSIS — M545 Low back pain, unspecified: Secondary | ICD-10-CM | POA: Diagnosis present

## 2022-04-06 DIAGNOSIS — M5442 Lumbago with sciatica, left side: Secondary | ICD-10-CM | POA: Diagnosis not present

## 2022-04-06 DIAGNOSIS — I10 Essential (primary) hypertension: Secondary | ICD-10-CM | POA: Insufficient documentation

## 2022-04-06 DIAGNOSIS — E119 Type 2 diabetes mellitus without complications: Secondary | ICD-10-CM | POA: Insufficient documentation

## 2022-04-06 DIAGNOSIS — M25552 Pain in left hip: Secondary | ICD-10-CM | POA: Insufficient documentation

## 2022-04-06 DIAGNOSIS — Z7984 Long term (current) use of oral hypoglycemic drugs: Secondary | ICD-10-CM | POA: Diagnosis not present

## 2022-04-06 DIAGNOSIS — Z79899 Other long term (current) drug therapy: Secondary | ICD-10-CM | POA: Insufficient documentation

## 2022-04-06 DIAGNOSIS — M5432 Sciatica, left side: Secondary | ICD-10-CM

## 2022-04-06 MED ORDER — PREDNISONE 50 MG PO TABS
60.0000 mg | ORAL_TABLET | Freq: Once | ORAL | Status: AC
  Administered 2022-04-06: 60 mg via ORAL
  Filled 2022-04-06: qty 1

## 2022-04-06 MED ORDER — PREDNISONE 20 MG PO TABS: 40.0000 mg | ORAL_TABLET | Freq: Every day | ORAL | 0 refills | Status: AC

## 2022-04-06 MED ORDER — HYDROCODONE-ACETAMINOPHEN 5-325 MG PO TABS
1.0000 | ORAL_TABLET | Freq: Once | ORAL | Status: AC
  Administered 2022-04-06: 1 via ORAL
  Filled 2022-04-06: qty 1

## 2022-04-06 MED ORDER — PREDNISONE 10 MG PO TABS: 20.0000 mg | ORAL_TABLET | Freq: Every day | ORAL | 0 refills | Status: DC

## 2022-04-06 MED ORDER — CYCLOBENZAPRINE HCL 10 MG PO TABS: 10.0000 mg | ORAL_TABLET | Freq: Two times a day (BID) | ORAL | 0 refills | Status: DC | PRN

## 2022-04-06 NOTE — Discharge Instructions (Addendum)
Note x-ray was negative for any acute fracture or dislocation.  As discussed, will treat with prednisone for the next several days.  Continue take gabapentin as prescribed.  You may additionally use Tylenol/Motrin as needed.  I will also send in muscle laxer to use as needed; note this medication can cause drowsiness so please do not drive while taking medication.  Will send in referral for physical therapy and recommended close follow-up with your back doctor for reassessment.  Please do not hesitate to return to emergency department for worrisome signs and symptoms we discussed become apparent.

## 2022-04-06 NOTE — ED Triage Notes (Addendum)
Left hip pain for years and has seen a dr  but it got bad yesterday , pain radiates  down left leg , pt able to ambulate states  started when she got an epidural 5 years , denies injury , takes gabapentin for the pain but doesn't help now

## 2022-04-06 NOTE — ED Provider Notes (Signed)
[facility] Provider Note   CSN: [id number] Arrival date & time: 04/06/22  0935     History  Chief Complaint  Patient presents with   Hip Pain    Laura Kramer is a 47 y.o. female.  HPI   47 year old female presents emergency department with complaints of left hip/low back pain with radiation down left leg.  Patient reports history of similar symptoms over the past 5 years after obtaining epidural during prior pregnancy.  Patient states she has seen neurosurgery as well as physical therapy for said issue and is on gabapentin chronically for condition.  Notes symptoms worsening yesterday.  Denies any known trauma/injury.  States has been taking Tylenol in addition to her gabapentin which has helped some.  Denies fever, saddle anesthesia, bowel/bladder dysfunction, weakness/sensory deficit lower extremities, history of IV drug use, known malignancy.  States the pain is worsened with ambulation/movement of her left hip and is relieved with rest.  Denies abdominal pain, nausea, vomiting, chest pain, urinary symptoms, vaginal symptoms, change in bowel habits.  Past medical history significant for diabetes mellitus, GERD, H. pylori, rotator cuff tear.  Prior abdominal surgeries include laparoscopic hysterectomy with bilateral salpingectomy 6/23, laparoscopic cholecystectomy [id number], umbilical hernia repair [id number]  Home Medications Prior to Admission medications   Medication Sig Start Date End Date Taking? Authorizing Provider  cyclobenzaprine (FLEXERIL) 10 MG tablet Take 1 tablet (10 mg total) by mouth 2 (two) times daily as needed for muscle spasms. 04/06/22  Yes Dion Saucier A, PA  predniSONE (DELTASONE) 20 MG tablet Take 2 tablets (40 mg total) by mouth daily with breakfast for 5 days. 04/07/22 04/12/22 Yes Dion Saucier A, PA  dapagliflozin propanediol (FARXIGA) 5 MG TABS tablet Take by mouth daily.    [provider]  diclofenac  (CATAFLAM) 50 MG tablet Take 50 mg by mouth 2 (two) times daily.    [provider]  fluconazole (DIFLUCAN) 50 MG tablet Take 50 mg by mouth once a week.    [provider]  gabapentin (NEURONTIN) 300 MG capsule Take 600 mg by mouth 3 (three) times daily.    [provider]  glipiZIDE (GLUCOTROL XL) 10 MG 24 hr tablet Take 10 mg by mouth daily with breakfast.    [provider]  ibuprofen (ADVIL) 600 MG tablet Take 1 tablet (600 mg total) by mouth every 6 (six) hours. 06/17/21   [name], Sherren Mocha, MD  linaclotide (LINZESS) 290 MCG CAPS capsule Take 290 mcg by mouth daily before breakfast.    [provider]  oxyCODONE (OXY IR/ROXICODONE) 5 MG immediate release tablet Take 1 tablet (5 mg total) by mouth every 4 (four) hours as needed for severe pain. 06/16/21   [name], Sherren Mocha, MD  phentermine (ADIPEX-P) 37.5 MG tablet Patient stated she will stop now until after 06/16/21 surgery. 08/06/17   [provider]  rizatriptan (MAXALT-MLT) 10 MG disintegrating tablet Take 10 mg by mouth as needed for migraine. May repeat in 2 hours if needed    [provider]  Semaglutide (OZEMPIC, 2 MG/DOSE, [place]) Inject into the skin.    [provider]      Allergies    Procardia [nifedipine], Ciprofloxacin, and Clindamycin/lincomycin    Review of Systems   Review of Systems  All other systems reviewed and are negative.   Physical Exam Updated Vital Signs BP (!) 157/95   Pulse 78   Temp 97.8 F (36.6 C) (Oral)   Resp 20  Ht 5\' 3"  (1.6 m)   Wt 98.9 kg   LMP 05/22/2021   SpO2 98%   BMI 38.62 kg/m  Physical Exam Vitals and nursing note reviewed.  Constitutional:      General: She is not in acute distress.    Appearance: She is well-developed.  HENT:     Head: Normocephalic and atraumatic.  Eyes:     Conjunctiva/sclera: Conjunctivae normal.  Cardiovascular:     Rate and Rhythm: Normal rate and regular rhythm.     Heart sounds: No  murmur heard. Pulmonary:     Effort: Pulmonary effort is normal. No respiratory distress.     Breath sounds: Normal breath sounds.  Abdominal:     Palpations: Abdomen is soft.     Tenderness: There is no abdominal tenderness.  Musculoskeletal:        General: No swelling.     Cervical back: Neck supple.     Comments: No midline tenderness of cervical, thoracic, lumbar spine with no obvious step-off or deformity noted.  Paraspinal tenderness noted in the left lumbar region.  Straight leg raise positive on the left side.  Muscular strength 5 out of 5 bilateral lower extremities.  No sensory deficits along major nerve distributions of lower extremities.  DTR symmetric patellar.  Pedal pulses 2+ bilaterally.  Skin:    General: Skin is warm and dry.     Capillary Refill: Capillary refill takes less than 2 seconds.  Neurological:     Mental Status: She is alert.  Psychiatric:        Mood and Affect: Mood normal.     ED Results / Procedures / Treatments   Labs (all labs ordered are listed, but only abnormal results are displayed) Labs Reviewed - No data to display  EKG None  Radiology DG Hip Unilat With Pelvis 2-3 Views Left  Result Date: 04/06/2022 CLINICAL DATA:  Pain EXAM: DG HIP (WITH OR WITHOUT PELVIS) 3V LEFT COMPARISON:  01/22/2013 FINDINGS: There is no evidence of hip fracture or dislocation. There is no evidence of arthropathy or other focal bone abnormality. IMPRESSION: Negative. Electronically Signed   By: Sammie Bench M.D.   On: 04/06/2022 10:42    Procedures Procedures    Medications Ordered in ED Medications  predniSONE (DELTASONE) tablet 60 mg (60 mg Oral Given 04/06/22 1039)  HYDROcodone-acetaminophen (NORCO/VICODIN) 5-325 MG per tablet 1 tablet (1 tablet Oral Given 04/06/22 1039)    ED Course/ Medical Decision Making/ A&P                             Medical Decision Making  This patient presents to the ED for concern of hip pain, this involves an extensive  number of treatment options, and is a complaint that carries with it a high risk of complications and morbidity.  The differential diagnosis includes fracture, dislocation, septic arthritis, osteoarthritis, AVN, osteomyelitis   Co morbidities that complicate the patient evaluation  See HPI   Additional history obtained:  Additional history obtained from EMR External records from outside source obtained and reviewed including hospital records   Lab Tests:  N/a   Imaging Studies ordered:  I ordered imaging studies including x-ray left hip I independently visualized and interpreted imaging which showed no acute osseous abnormality I agree with the radiologist interpretation   Cardiac Monitoring: / EKG:  The patient was maintained on a cardiac monitor.  I personally viewed and interpreted the cardiac monitored which  showed an underlying rhythm of: sinus rhythm   Consultations Obtained:  N/a   Problem List / ED Course / Critical interventions / Medication management  Low back pain with left-sided sciatica I ordered medication including prednisone, norco    Reevaluation of the patient after these medicines showed that the patient improved I have reviewed the patients home medicines and have made adjustments as needed   Social Determinants of Health:  Denies tobacco, illicit drug use.   Test / Admission - Considered:  Low back pain with left-sided sciatica Vitals signs significant for hypertension with blood pressure 157/95. Otherwise within normal range and stable throughout visit. Imaging studies significant for: See above 47 year old female presents emergency department with complaints of left low back pain with sciatica type reticular symptoms down left lower extremity.  Patient upon history of red flag signs negative without acute abnormality indicative of spinal cord compression appreciated on physical exam.  Low suspicion for cauda equina, spinal epidural  abscess.  Given no traumatic mechanism, suspicion for acute fracture/dislocation extremely low.  Patient with acute on chronic left-sided sciatica.  Will treat with prednisone and muscle laxer to use as needed with referral for physical therapy given patient's significant improvement with physical therapy in the past.  Also recommend close follow-up with neurosurgery for reevaluation as she already has established care.  Treatment plan discussed at length with patient and she acknowledged understanding was agreeable to said plan. Worrisome signs and symptoms were discussed with the patient, and the patient acknowledged understanding to return to the ED if noticed. Patient was stable upon discharge.          Final Clinical Impression(s) / ED Diagnoses Final diagnoses:  Sciatica of left side    Rx / DC Orders ED Discharge Orders          Ordered    predniSONE (DELTASONE) 10 MG tablet  Daily,   Status:  Discontinued        04/06/22 1050    predniSONE (DELTASONE) 20 MG tablet  Daily with breakfast        04/06/22 1055    Ambulatory referral to Physical Therapy       Comments: Sciatica left   04/06/22 1050    cyclobenzaprine (FLEXERIL) 10 MG tablet  2 times daily PRN        04/06/22 1050              Peter Garter, [place] 04/06/22 1056    Blane Ohara, MD 04/08/22 1530

## 2022-04-25 ENCOUNTER — Ambulatory Visit: Payer: Medicaid Other

## 2022-05-02 IMAGING — CT CT ANGIO CHEST
2 of 8 series · 18 of 36 positions shown · IV contrast (agent unspecified)
Comparison: 07/10/2020

CLINICAL DATA: Pulmonary embolus suspected with high probability.

EXAM:
CT ANGIOGRAPHY CHEST WITH CONTRAST
TECHNIQUE: Multidetector CT imaging of the chest was performed using the
standard protocol during bolus administration of intravenous
contrast. Multiplanar CT image reconstructions and MIPs were
obtained to evaluate the vascular anatomy.

[Series 6: pe thins · axial · 0.70mm/px · z∈[-252,-26]mm · 17 of 254 slices shown]
[im 14/254  lung]
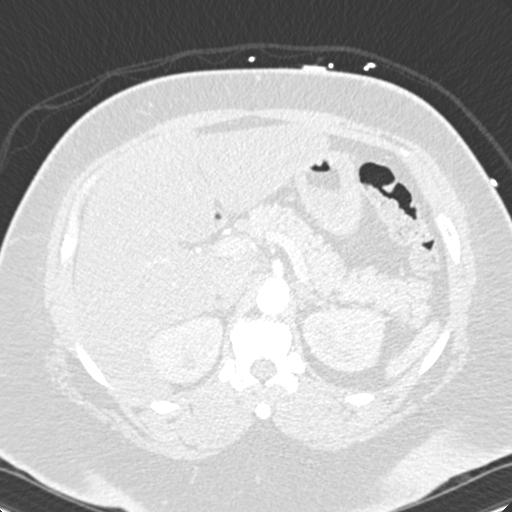
[im 27/254  mediastinal]
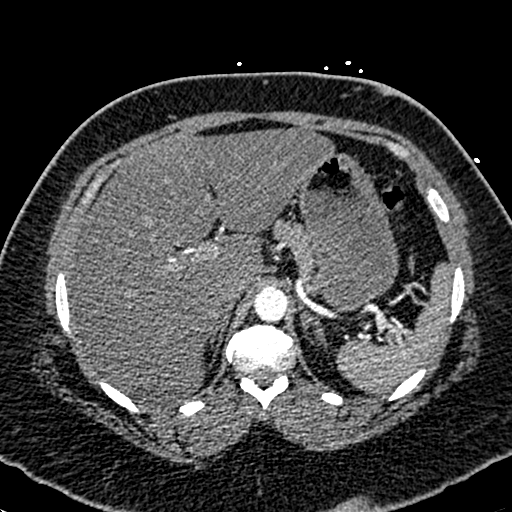
[im 40/254  lung]
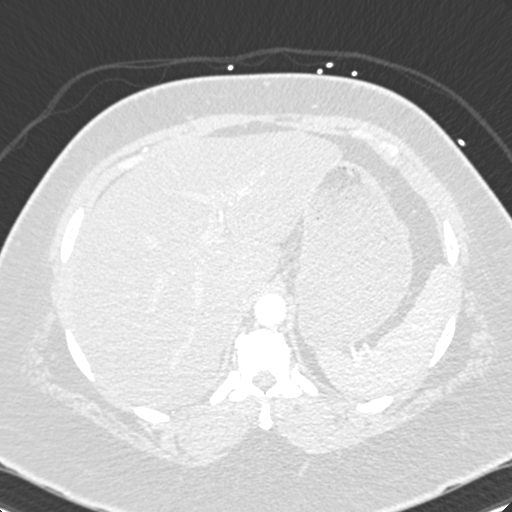
[im 54/254  mediastinal]
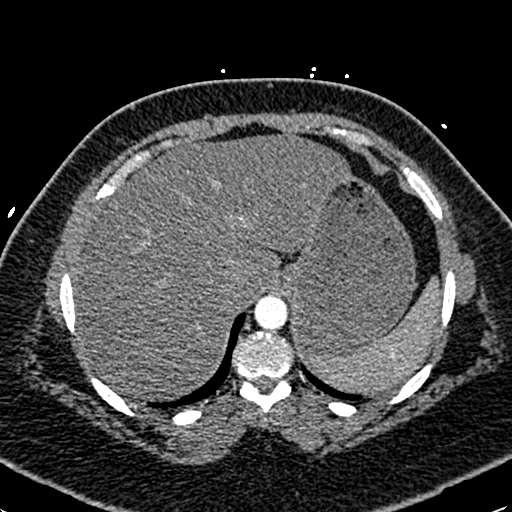
[im 67/254  lung]
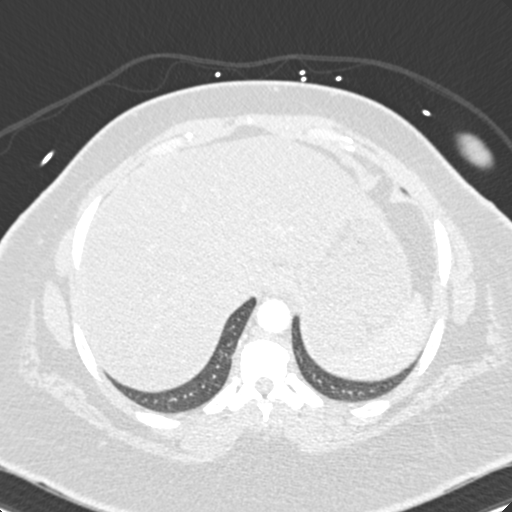
[im 80/254  mediastinal]
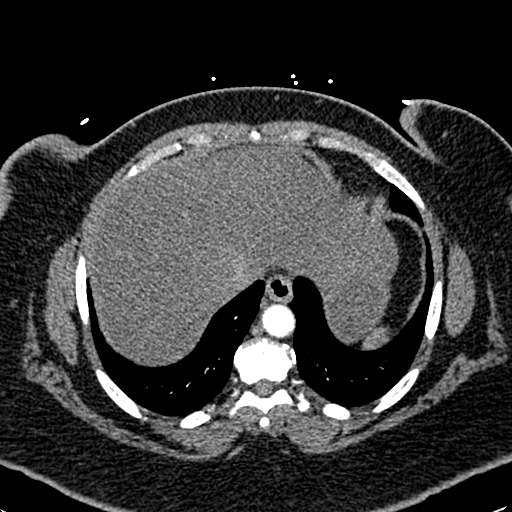
[im 94/254  lung]
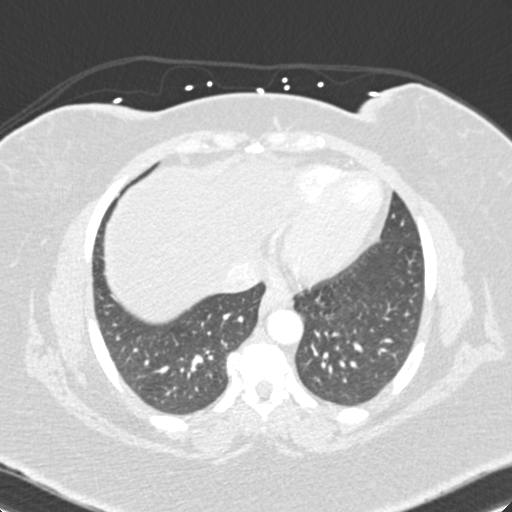
[im 107/254  mediastinal]
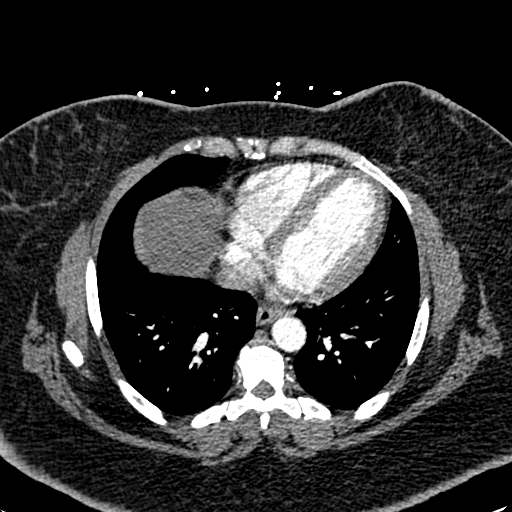
[im 134/254  lung]
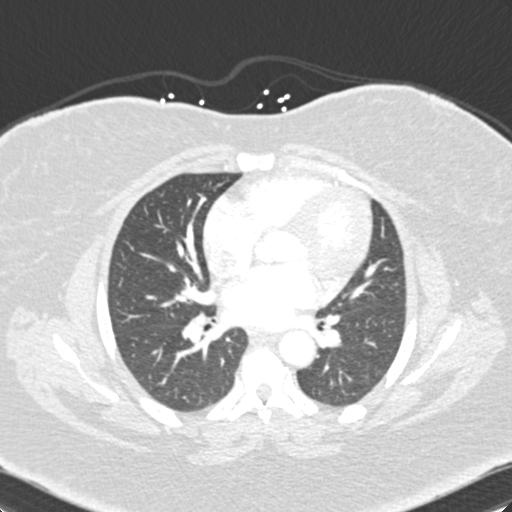
[im 147/254  mediastinal]
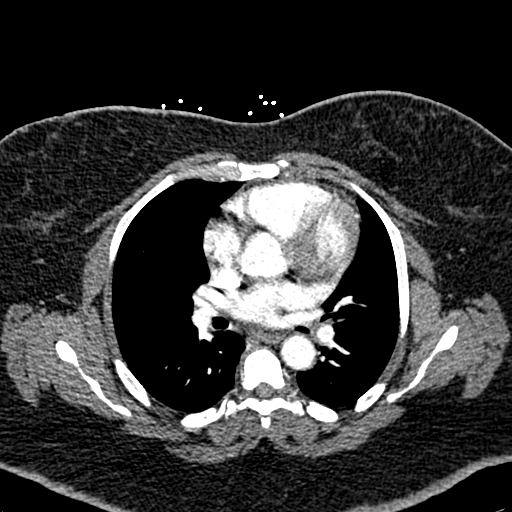
[im 160/254  lung]
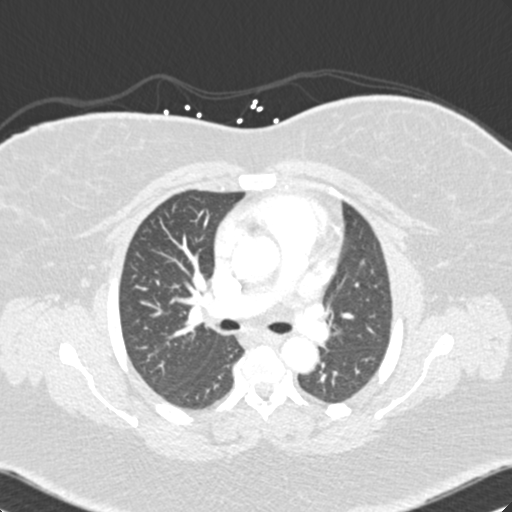
[im 174/254  mediastinal]
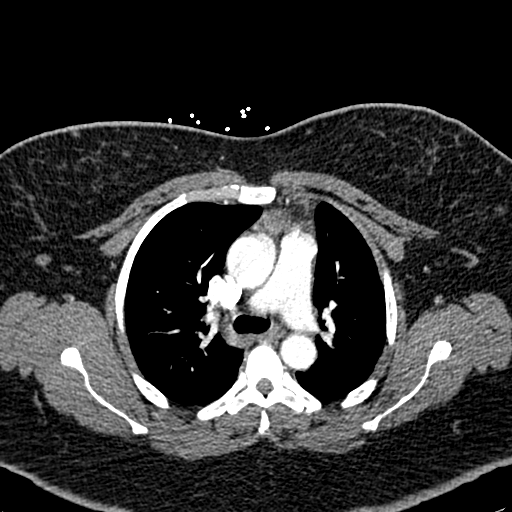
[im 187/254  lung]
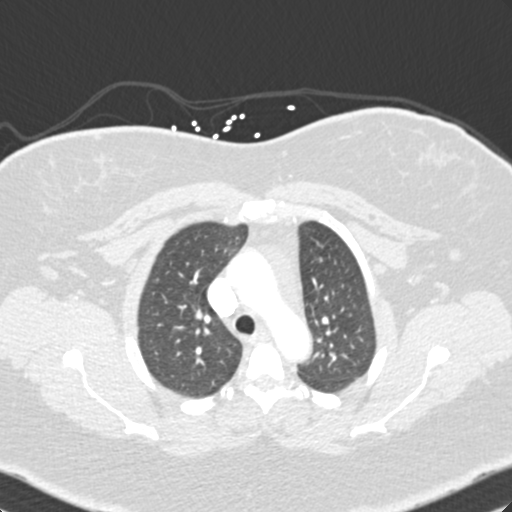
[im 200/254  mediastinal]
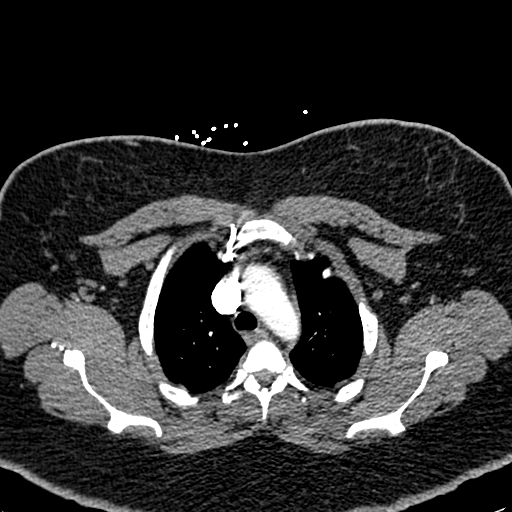
[im 214/254  lung]
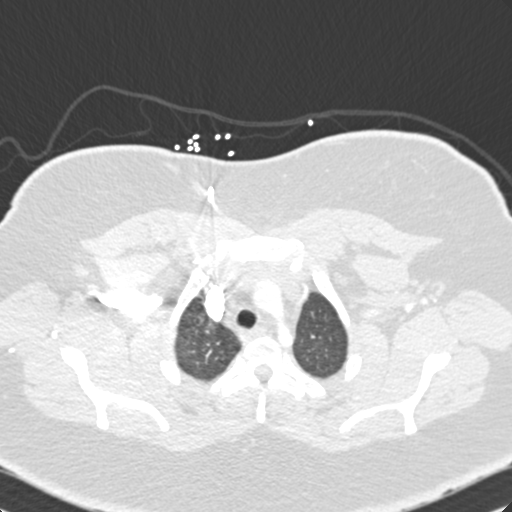
[im 227/254  mediastinal]
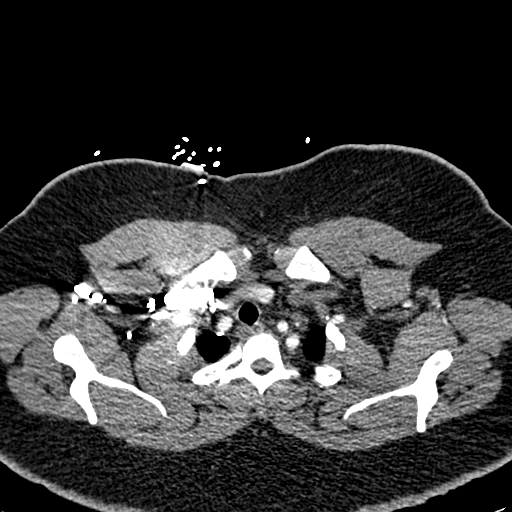
[im 240/254  lung]
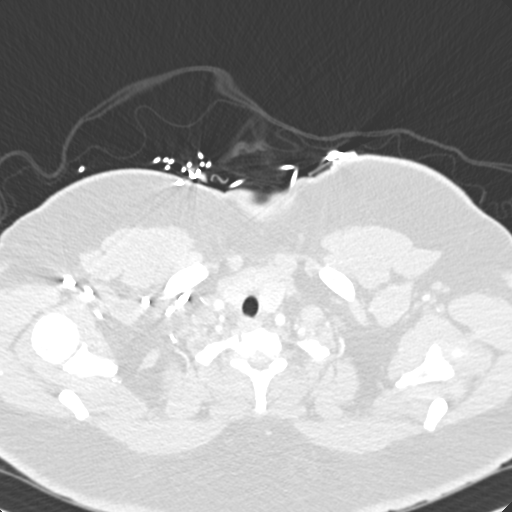

[Series 7: pe coronal mpr · coronal · 0.51mm/px · 1 of 157 slices shown]
[im 79/157  mediastinal]
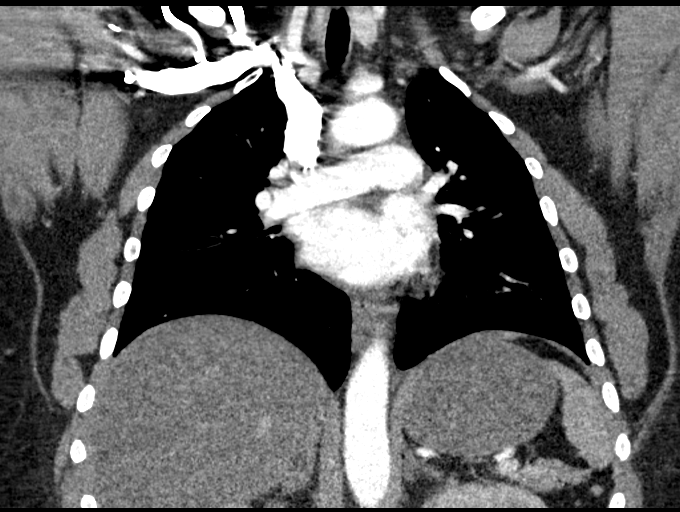

[18 of 36 positions shown; findings below may reference images not displayed]

RADIATION DOSE REDUCTION: This exam was performed according to the
departmental dose-optimization program which includes automated
exposure control, adjustment of the mA and/or kV according to
patient size and/or use of iterative reconstruction technique.

CONTRAST:  100mL OMNIPAQUE IOHEXOL 350 MG/ML SOLN
FINDINGS: Cardiovascular: Good opacification of the central and segmental
pulmonary arteries. No focal filling defects identified. No evidence
of significant pulmonary embolus. Normal heart size. No pericardial
effusions. Normal caliber thoracic aorta. No aortic dissection.
Great vessel origins are patent.

Mediastinum/Nodes: No enlarged mediastinal, hilar, or axillary lymph
nodes. Thyroid gland, trachea, and esophagus demonstrate no
significant findings.

Lungs/Pleura: Lungs are clear. No pleural effusion or pneumothorax.

Upper Abdomen: Diffuse fatty infiltration of the liver. Surgical
absence of the gallbladder.

Musculoskeletal: No chest wall abnormality. No acute or significant
osseous findings.

Review of the MIP images confirms the above findings.
IMPRESSION: 1. No evidence of significant pulmonary embolus.
2. No evidence of active pulmonary disease.
3. Fatty infiltration of the liver.

## 2022-07-17 ENCOUNTER — Emergency Department (HOSPITAL_BASED_OUTPATIENT_CLINIC_OR_DEPARTMENT_OTHER)
Admission: EM | Admit: 2022-07-17 | Discharge: 2022-07-17 | Disposition: A | Discharge status: Home / Self Care | Payer: Medicaid Other | Attending: Emergency Medicine | Admitting: Emergency Medicine

## 2022-07-17 ENCOUNTER — Other Ambulatory Visit: Payer: Self-pay

## 2022-07-17 DIAGNOSIS — R519 Headache, unspecified: Secondary | ICD-10-CM | POA: Insufficient documentation

## 2022-07-17 MED ORDER — DIPHENHYDRAMINE HCL 50 MG/ML IJ SOLN
25.0000 mg | Freq: Once | INTRAMUSCULAR | Status: AC
  Administered 2022-07-17: 25 mg via INTRAVENOUS

## 2022-07-17 MED ORDER — PROCHLORPERAZINE EDISYLATE 10 MG/2ML IJ SOLN
10.0000 mg | Freq: Once | INTRAMUSCULAR | Status: AC
  Administered 2022-07-17: 10 mg via INTRAVENOUS

## 2022-07-17 MED ORDER — PROCHLORPERAZINE EDISYLATE 10 MG/2ML IJ SOLN
10.0000 mg | Freq: Once | INTRAMUSCULAR | Status: DC
  Filled 2022-07-17: qty 2

## 2022-07-17 MED ORDER — DIPHENHYDRAMINE HCL 50 MG/ML IJ SOLN
25.0000 mg | Freq: Once | INTRAMUSCULAR | Status: DC
  Filled 2022-07-17: qty 1

## 2022-07-17 MED ORDER — DEXAMETHASONE 4 MG PO TABS
10.0000 mg | ORAL_TABLET | Freq: Once | ORAL | Status: AC
  Administered 2022-07-17: 10 mg via ORAL
  Filled 2022-07-17: qty 3

## 2022-07-17 NOTE — ED Triage Notes (Signed)
Patient presents to ED via POV from home. Here with migraine since last night. History of same. Reports light and sound sensitivity.

## 2022-07-17 NOTE — Discharge Instructions (Signed)
Follow-up with your doctor and with the neurologist in the office.  Please return for worsening headache one-sided numbness or weakness difficulty speech or swallowing or if you develop fever.

## 2022-07-17 NOTE — ED Provider Notes (Signed)
[place] [facility] Provider Note   CSN: [id number] Arrival date & time: 07/17/22  1248     History  Chief Complaint  Patient presents with   Headache    Laura Kramer is a 47 y.o. female.  47 yo F with a chief complaint of a head ache.  This been going on since last night.  Feels like her prior headaches but this once a bit worse.  Denies trauma denies fevers denies cough or congestion.  Denies one-sided numbness or weakness denies difficulty speech or swallowing.  Worse with bright lights and loud noises.  Having some nausea as well.   Headache      Home Medications Prior to Admission medications   Medication Sig Start Date End Date Taking? Authorizing Provider  cyclobenzaprine (FLEXERIL) 10 MG tablet Take 1 tablet (10 mg total) by mouth 2 (two) times daily as needed for muscle spasms. 04/06/22   Peter Garter, PA  dapagliflozin propanediol (FARXIGA) 5 MG TABS tablet Take by mouth daily.    [provider]  diclofenac (CATAFLAM) 50 MG tablet Take 50 mg by mouth 2 (two) times daily.    [provider]  fluconazole (DIFLUCAN) 50 MG tablet Take 50 mg by mouth once a week.    [provider]  gabapentin (NEURONTIN) 300 MG capsule Take 600 mg by mouth 3 (three) times daily.    [provider]  glipiZIDE (GLUCOTROL XL) 10 MG 24 hr tablet Take 10 mg by mouth daily with breakfast.    [provider]  ibuprofen (ADVIL) 600 MG tablet Take 1 tablet (600 mg total) by mouth every 6 (six) hours. 06/17/21   [name], [name], MD  linaclotide (LINZESS) 290 MCG CAPS capsule Take 290 mcg by mouth daily before breakfast.    [provider]  oxyCODONE (OXY IR/ROXICODONE) 5 MG immediate release tablet Take 1 tablet (5 mg total) by mouth every 4 (four) hours as needed for severe pain. 06/16/21   [name], [name], MD  phentermine (ADIPEX-P) 37.5 MG tablet Patient stated she will stop now until after 06/16/21  surgery. 08/06/17   [provider]  rizatriptan (MAXALT-MLT) 10 MG disintegrating tablet Take 10 mg by mouth as needed for migraine. May repeat in 2 hours if needed    [provider]  Semaglutide (OZEMPIC, 2 MG/DOSE, [place]) Inject into the skin.    [provider]      Allergies    Procardia [nifedipine], Ciprofloxacin, and Clindamycin/lincomycin    Review of Systems   Review of Systems  Neurological:  Positive for headaches.    Physical Exam Updated Vital Signs BP (!) 134/93 (BP Location: Left Arm)   Pulse 87   Temp 98.8 F (37.1 C)   Resp 18   Ht 5\' 4"  (1.626 m)   LMP 05/22/2021   SpO2 98%   BMI 37.42 kg/m  Physical Exam Vitals and nursing note reviewed.  Constitutional:      General: She is not in acute distress.    Appearance: She is well-developed. She is not diaphoretic.  HENT:     Head: Normocephalic and atraumatic.  Eyes:     Pupils: Pupils are equal, round, and reactive to light.  Cardiovascular:     Rate and Rhythm: Normal rate and regular rhythm.     Heart sounds: No murmur heard.    No friction rub. No gallop.  Pulmonary:     Effort: Pulmonary effort is normal.  Breath sounds: No wheezing or rales.  Abdominal:     General: There is no distension.     Palpations: Abdomen is soft.     Tenderness: There is no abdominal tenderness.  Musculoskeletal:        General: No tenderness.     Cervical back: Normal range of motion and neck supple.  Skin:    General: Skin is warm and dry.  Neurological:     Mental Status: She is alert and oriented to person, place, and time.     Cranial Nerves: Cranial nerves 2-12 are intact.     Sensory: Sensation is intact.     Motor: Motor function is intact.     Coordination: Coordination is intact.     Comments: Benign neurologic exam.  Psychiatric:        Behavior: Behavior normal.     ED Results / Procedures / Treatments   Labs (all labs ordered are listed, but only abnormal results are  displayed) Labs Reviewed - No data to display  EKG None  Radiology No results found.  Procedures Procedures    Medications Ordered in ED Medications  dexamethasone (DECADRON) tablet 10 mg (10 mg Oral Given 07/17/22 1355)  diphenhydrAMINE (BENADRYL) injection 25 mg (25 mg Intravenous Given 07/17/22 1403)  prochlorperazine (COMPAZINE) injection 10 mg (10 mg Intravenous Given 07/17/22 1405)    ED Course/ Medical Decision Making/ A&P                             Medical Decision Making Risk Prescription drug management.   38 yoF with a chief complaint of a headache.  This has been going on since yesterday.  Feels typical of her headaches in the past.  Has a benign neurologic exam.  Will give a headache cocktail here.  Reassess.  Patient feeling quite a bit better on repeat assessment would like to go home.  PCP follow-up.  Refer to neurology as well.  2:47 PM:  I have discussed the diagnosis/risks/treatment options with the patient.  Evaluation and diagnostic testing in the emergency department does not suggest an emergent condition requiring admission or immediate intervention beyond what has been performed at this time.  They will follow up with PCP, neuro. We also discussed returning to the ED immediately if new or worsening sx occur. We discussed the sx which are most concerning (e.g., sudden worsening pain, fever, inability to tolerate by mouth) that necessitate immediate return. Medications administered to the patient during their visit and any new prescriptions provided to the patient are listed below.  Medications given during this visit Medications  dexamethasone (DECADRON) tablet 10 mg (10 mg Oral Given 07/17/22 1355)  diphenhydrAMINE (BENADRYL) injection 25 mg (25 mg Intravenous Given 07/17/22 1403)  prochlorperazine (COMPAZINE) injection 10 mg (10 mg Intravenous Given 07/17/22 1405)     The patient appears reasonably screen and/or stabilized for discharge and I doubt any  other medical condition or other [facility] requiring further screening, evaluation, or treatment in the ED at this time prior to discharge.          Final Clinical Impression(s) / ED Diagnoses Final diagnoses:  Bad headache    Rx / DC Orders ED Discharge Orders          Ordered    Ambulatory referral to Neurology       Comments: Headache syndrome   07/17/22 1446  Melene Plan, DO 07/17/22 1447

## 2022-11-13 ENCOUNTER — Encounter (HOSPITAL_BASED_OUTPATIENT_CLINIC_OR_DEPARTMENT_OTHER): Payer: Self-pay

## 2022-11-13 ENCOUNTER — Other Ambulatory Visit: Payer: Self-pay

## 2022-11-13 ENCOUNTER — Emergency Department (HOSPITAL_BASED_OUTPATIENT_CLINIC_OR_DEPARTMENT_OTHER)
Admission: EM | Admit: 2022-11-13 | Discharge: 2022-11-13 | Disposition: A | Discharge status: Home / Self Care | Payer: Medicaid Other | Attending: Emergency Medicine | Admitting: Emergency Medicine

## 2022-11-13 DIAGNOSIS — Z20822 Contact with and (suspected) exposure to covid-19: Secondary | ICD-10-CM | POA: Insufficient documentation

## 2022-11-13 DIAGNOSIS — J069 Acute upper respiratory infection, unspecified: Secondary | ICD-10-CM | POA: Insufficient documentation

## 2022-11-13 DIAGNOSIS — R059 Cough, unspecified: Secondary | ICD-10-CM | POA: Diagnosis present

## 2022-11-13 LAB — RESP PANEL BY RT-PCR (RSV, FLU A&B, COVID)  RVPGX2
Influenza A by PCR: NEGATIVE
Influenza B by PCR: NEGATIVE
Resp Syncytial Virus by PCR: NEGATIVE
SARS Coronavirus 2 by RT PCR: NEGATIVE

## 2022-11-13 MED ORDER — BENZONATATE 200 MG PO CAPS: 200.0000 mg | ORAL_CAPSULE | Freq: Three times a day (TID) | ORAL | 0 refills | Status: AC

## 2022-11-13 NOTE — ED Triage Notes (Signed)
Pt c/o congestion, sore throat, runny nose, chills, coughing up mucous. Pt states she woke up feeling this way 11/12/22 but now it feels like it is moving down into her chest.   Robina Ade, RN

## 2022-11-13 NOTE — Discharge Instructions (Addendum)
Your swab is negative for COVID, flu, RSV. There are many viruses that cause respiratory illnesses. Please follow up with your primary care provider if not improving in 10 days.  You may continue with over the counter cold medications. A prescription for cough medicine has been sent to your pharmacy. Home to rest, hydrate with low sugar fluids.

## 2022-11-13 NOTE — ED Provider Notes (Signed)
[place] [facility] Provider Note   CSN: [id number] Arrival date & time: 11/13/22  0932     History  Chief Complaint  Patient presents with   Sore Throat   Cough   Fever    Laura Kramer is a 47 y.o. female.  47 year old female presents with complaint of sinus congestion, sore throat, cough, fever (t max 99.6), chills, body aches. No known sick contacts. Symptoms started yesterday upon waking, no relief with multiple OTC meds.  No history of asthma or chronic lung disease, never smoker.        Home Medications Prior to Admission medications   Medication Sig Start Date End Date Taking? Authorizing Provider  benzonatate (TESSALON) 200 MG capsule Take 1 capsule (200 mg total) by mouth every 8 (eight) hours for 10 days. 11/13/22 11/23/22 Yes Jeannie Fend, PA-C  cyclobenzaprine (FLEXERIL) 10 MG tablet Take 1 tablet (10 mg total) by mouth 2 (two) times daily as needed for muscle spasms. 04/06/22   Peter Garter, PA  dapagliflozin propanediol (FARXIGA) 5 MG TABS tablet Take by mouth daily.    [provider]  diclofenac (CATAFLAM) 50 MG tablet Take 50 mg by mouth 2 (two) times daily.    [provider]  fluconazole (DIFLUCAN) 50 MG tablet Take 50 mg by mouth once a week.    [provider]  gabapentin (NEURONTIN) 300 MG capsule Take 600 mg by mouth 3 (three) times daily.    [provider]  glipiZIDE (GLUCOTROL XL) 10 MG 24 hr tablet Take 10 mg by mouth daily with breakfast.    [provider]  ibuprofen (ADVIL) 600 MG tablet Take 1 tablet (600 mg total) by mouth every 6 (six) hours. 06/17/21   [name], [name], MD  linaclotide (LINZESS) 290 MCG CAPS capsule Take 290 mcg by mouth daily before breakfast.    [provider]  oxyCODONE (OXY IR/ROXICODONE) 5 MG immediate release tablet Take 1 tablet (5 mg total) by mouth every 4 (four) hours as needed for severe pain. 06/16/21   [name],  [name], MD  phentermine (ADIPEX-P) 37.5 MG tablet Patient stated she will stop now until after 06/16/21 surgery. 08/06/17   [provider]  rizatriptan (MAXALT-MLT) 10 MG disintegrating tablet Take 10 mg by mouth as needed for migraine. May repeat in 2 hours if needed    [provider]  Semaglutide (OZEMPIC, 2 MG/DOSE, [place]) Inject into the skin.    [provider]      Allergies    Procardia [nifedipine], Ciprofloxacin, and Clindamycin/lincomycin    Review of Systems   Review of Systems Negative except as per HPI Physical Exam Updated Vital Signs BP 138/87 (BP Location: Right Arm)   Pulse 88   Temp 98.6 F (37 C) (Oral)   Resp 16   Ht 5\' 3"  (1.6 m)   Wt 98.9 kg   LMP 05/22/2021   SpO2 99%   BMI 38.62 kg/m  Physical Exam Vitals and nursing note reviewed.  Constitutional:      General: She is not in acute distress.    Appearance: She is well-developed. She is not diaphoretic.  HENT:     Head: Normocephalic and atraumatic.     Right Ear: Tympanic membrane and ear canal normal.     Left Ear: Tympanic membrane and ear canal normal.     Nose: Congestion and rhinorrhea present.     Mouth/Throat:     Mouth: Mucous membranes  are moist.     Pharynx: Oropharynx is clear. Uvula midline. No pharyngeal swelling or posterior oropharyngeal erythema.     Tonsils: No tonsillar exudate or tonsillar abscesses.  Eyes:     Conjunctiva/sclera: Conjunctivae normal.  Cardiovascular:     Rate and Rhythm: Normal rate and regular rhythm.     Heart sounds: Normal heart sounds.  Pulmonary:     Effort: Pulmonary effort is normal.     Breath sounds: Normal breath sounds.  Musculoskeletal:     Cervical back: Neck supple.  Lymphadenopathy:     Cervical: No cervical adenopathy.  Skin:    General: Skin is warm and dry.     Findings: No erythema or rash.  Neurological:     Mental Status: She is alert and oriented to person, place, and time.  Psychiatric:        Behavior:  Behavior normal.     ED Results / Procedures / Treatments   Labs (all labs ordered are listed, but only abnormal results are displayed) Labs Reviewed  RESP PANEL BY RT-PCR (RSV, FLU A&B, COVID)  [id number]    EKG None  Radiology No results found.  Procedures Procedures    Medications Ordered in ED Medications - No data to display  ED Course/ Medical Decision Making/ A&P                                 Medical Decision Making Risk Prescription drug management.   47 year old female with URI symptoms, onset yesterday. Non toxic, in no distress of exam, does have quite a bit of nasal congestion. Viral swab negative for COVID/ flu/ RSV. Recommend home to rest, hydrate, OTC meds and Tessalon as needed as directed. See PCP if not improving in 10 days.         Final Clinical Impression(s) / ED Diagnoses Final diagnoses:  Viral URI with cough    Rx / DC Orders ED Discharge Orders          Ordered    benzonatate (TESSALON) 200 MG capsule  Every 8 hours        11/13/22 1039              Jeannie Fend, PA-C 11/13/22 1041    [place], Adam, DO 11/13/22 1430

## 2022-12-19 ENCOUNTER — Other Ambulatory Visit: Payer: Self-pay

## 2022-12-19 ENCOUNTER — Encounter (HOSPITAL_BASED_OUTPATIENT_CLINIC_OR_DEPARTMENT_OTHER): Payer: Self-pay | Admitting: Urology

## 2022-12-19 DIAGNOSIS — Z5321 Procedure and treatment not carried out due to patient leaving prior to being seen by health care provider: Secondary | ICD-10-CM | POA: Diagnosis not present

## 2022-12-19 DIAGNOSIS — M545 Low back pain, unspecified: Secondary | ICD-10-CM | POA: Insufficient documentation

## 2022-12-19 NOTE — ED Triage Notes (Signed)
Pt states lower back pain that started yesterday  States worse with movement and standing on feet  Denies any urinary concerns   Took gabapentin and flexeril with little relief

## 2022-12-20 ENCOUNTER — Emergency Department (HOSPITAL_BASED_OUTPATIENT_CLINIC_OR_DEPARTMENT_OTHER)
Admission: EM | Admit: 2022-12-20 | Discharge: 2022-12-20 | Discharge status: Against Medical Advice | Payer: Medicaid Other | Attending: Emergency Medicine | Admitting: Emergency Medicine

## 2023-05-07 ENCOUNTER — Other Ambulatory Visit: Payer: Self-pay

## 2023-05-07 ENCOUNTER — Encounter (HOSPITAL_BASED_OUTPATIENT_CLINIC_OR_DEPARTMENT_OTHER): Payer: Self-pay | Admitting: Emergency Medicine

## 2023-05-07 ENCOUNTER — Emergency Department (HOSPITAL_BASED_OUTPATIENT_CLINIC_OR_DEPARTMENT_OTHER)
Admission: EM | Admit: 2023-05-07 | Discharge: 2023-05-07 | Disposition: A | Discharge status: Home / Self Care | Attending: Emergency Medicine | Admitting: Emergency Medicine

## 2023-05-07 ENCOUNTER — Emergency Department (HOSPITAL_BASED_OUTPATIENT_CLINIC_OR_DEPARTMENT_OTHER)

## 2023-05-07 DIAGNOSIS — Z7984 Long term (current) use of oral hypoglycemic drugs: Secondary | ICD-10-CM | POA: Insufficient documentation

## 2023-05-07 DIAGNOSIS — E119 Type 2 diabetes mellitus without complications: Secondary | ICD-10-CM | POA: Insufficient documentation

## 2023-05-07 DIAGNOSIS — R1010 Upper abdominal pain, unspecified: Secondary | ICD-10-CM | POA: Insufficient documentation

## 2023-05-07 DIAGNOSIS — E871 Hypo-osmolality and hyponatremia: Secondary | ICD-10-CM | POA: Insufficient documentation

## 2023-05-07 LAB — COMPREHENSIVE METABOLIC PANEL WITH GFR
ALT: 26 U/L (ref 0–44)
AST: 17 U/L (ref 15–41)
Albumin: 4.3 g/dL (ref 3.5–5.0)
Alkaline Phosphatase: 116 U/L (ref 38–126)
Anion gap: 12 (ref 5–15)
BUN: 10 mg/dL (ref 6–20)
CO2: 22 mmol/L (ref 22–32)
Calcium: 9.7 mg/dL (ref 8.9–10.3)
Chloride: 100 mmol/L (ref 98–111)
Creatinine, Ser: 0.77 mg/dL (ref 0.44–1.00)
GFR, Estimated: >60 mL/min (ref >60)
Glucose, Bld: 233 mg/dL — ABNORMAL HIGH (ref 70–99)
Potassium: 3.8 mmol/L (ref 3.5–5.1)
Sodium: 134 mmol/L — ABNORMAL LOW (ref 135–145)
Total Bilirubin: <0.2 mg/dL (ref 0.0–1.2)
Total Protein: 7.3 g/dL (ref 6.5–8.1)

## 2023-05-07 LAB — URINALYSIS, MICROSCOPIC (REFLEX)

## 2023-05-07 LAB — CBC
HCT: 38.9 % (ref 36.0–46.0)
Hemoglobin: 13.1 g/dL (ref 12.0–15.0)
MCH: 27.2 pg (ref 26.0–34.0)
MCHC: 33.7 g/dL (ref 30.0–36.0)
MCV: 80.7 fL (ref 80.0–100.0)
Platelets: 286 10*3/uL (ref 150–400)
RBC: 4.82 MIL/uL (ref 3.87–5.11)
RDW: 13.2 % (ref 11.5–15.5)
WBC: 10.3 10*3/uL (ref 4.0–10.5)
nRBC: 0 % (ref 0.0–0.2)

## 2023-05-07 LAB — URINALYSIS, ROUTINE W REFLEX MICROSCOPIC
Bilirubin Urine: NEGATIVE
Glucose, UA: 100 mg/dL — AB
Hgb urine dipstick: NEGATIVE
Ketones, ur: NEGATIVE mg/dL
Nitrite: NEGATIVE
Protein, ur: NEGATIVE mg/dL
Specific Gravity, Urine: 1.02 (ref 1.005–1.030)
pH: 5 (ref 5.0–8.0)

## 2023-05-07 LAB — LIPASE, BLOOD: Lipase: 32 U/L (ref 11–51)

## 2023-05-07 MED ORDER — OXYCODONE HCL 5 MG PO TABS: 5.0000 mg | ORAL_TABLET | Freq: Four times a day (QID) | ORAL | 0 refills | Status: AC | PRN

## 2023-05-07 MED ORDER — KETOROLAC TROMETHAMINE 15 MG/ML IJ SOLN
15.0000 mg | Freq: Once | INTRAMUSCULAR | Status: AC
  Administered 2023-05-07: 15 mg via INTRAVENOUS
  Filled 2023-05-07: qty 1

## 2023-05-07 MED ORDER — ONDANSETRON 4 MG PO TBDP: 4.0000 mg | ORAL_TABLET | Freq: Three times a day (TID) | ORAL | 0 refills | Status: AC | PRN

## 2023-05-07 MED ORDER — HYDROMORPHONE HCL 1 MG/ML IJ SOLN
0.5000 mg | Freq: Once | INTRAMUSCULAR | Status: AC
  Administered 2023-05-07: 0.5 mg via INTRAVENOUS
  Filled 2023-05-07: qty 1

## 2023-05-07 MED ORDER — SODIUM CHLORIDE 0.9 % IV BOLUS
1000.0000 mL | Freq: Once | INTRAVENOUS | Status: AC
  Administered 2023-05-07: 1000 mL via INTRAVENOUS

## 2023-05-07 MED ORDER — IOHEXOL 300 MG/ML  SOLN
100.0000 mL | Freq: Once | INTRAMUSCULAR | Status: AC | PRN
  Administered 2023-05-07: 100 mL via INTRAVENOUS

## 2023-05-07 MED ORDER — ONDANSETRON HCL 4 MG/2ML IJ SOLN
4.0000 mg | Freq: Once | INTRAMUSCULAR | Status: AC
Start: 2023-05-07 — End: 2023-05-07
  Administered 2023-05-07: 4 mg via INTRAVENOUS
  Filled 2023-05-07: qty 2

## 2023-05-07 MED ORDER — DOCUSATE SODIUM 100 MG PO CAPS: 100.0000 mg | ORAL_CAPSULE | Freq: Two times a day (BID) | ORAL | 0 refills | Status: AC | PRN

## 2023-05-07 NOTE — ED Notes (Signed)
 Patient transported to CT

## 2023-05-07 NOTE — Discharge Instructions (Addendum)
 Please read and follow all provided instructions.  Your diagnoses today include:  1. Upper abdominal pain     Tests performed today include: Complete blood cell count: Blood cell counts were normal Complete metabolic panel: Blood sugar was a bit elevated otherwise no concerns Lipase (pancreas function test): Normal Urinalysis (urine test): Showed some white blood cells, but not definitive for infection Vital signs. See below for your results today.   Medications prescribed:  Zofran  (ondansetron ) - for nausea and vomiting  Oxycodone  - narcotic pain medication  DO NOT drive or perform any activities that require you to be awake and alert because this medicine can make you drowsy.   Colace - stool softener to help prevent constipation if taking the pain medication  This medication can be found over-the-counter.   Continue colace for 2 weeks after your stools return to normal to prevent constipation.   Take any prescribed medications only as directed.  Home care instructions:  Follow any educational materials contained in this packet.  Follow-up instructions: Please follow-up with your GI as planned in the next 2 days for further evaluation of your symptoms.    Return instructions:  SEEK IMMEDIATE MEDICAL ATTENTION IF: The pain does not go away or becomes severe  A temperature above 101F develops  Repeated vomiting occurs (multiple episodes)  The pain becomes localized to portions of the abdomen. The right side could possibly be appendicitis. In an adult, the left lower portion of the abdomen could be colitis or diverticulitis.  Blood is being passed in stools or vomit (bright red or black tarry stools)  You develop chest pain, difficulty breathing, dizziness or fainting, or become confused, poorly responsive, or inconsolable (young children) If you have any other emergent concerns regarding your health  Additional Information: Abdominal (belly) pain can be caused by many  things. Your caregiver performed an examination and possibly ordered blood/urine tests and imaging (CT scan, x-rays, ultrasound). Many cases can be observed and treated at home after initial evaluation in the emergency department. Even though you are being discharged home, abdominal pain can be unpredictable. Therefore, you need a repeated exam if your pain does not resolve, returns, or worsens. Most patients with abdominal pain don't have to be admitted to the hospital or have surgery, but serious problems like appendicitis and gallbladder attacks can start out as nonspecific pain. Many abdominal conditions cannot be diagnosed in one visit, so follow-up evaluations are very important.  Your vital signs today were: BP (!) 143/112 (BP Location: Left Arm)   Pulse 94   Temp 98.1 F (36.7 C) (Oral)   Resp 20   Ht 5\' 3"  (1.6 m)   Wt 98.9 kg   LMP 05/22/2021   SpO2 100%   BMI 38.62 kg/m  If your blood pressure (bp) was elevated above 135/85 this visit, please have this repeated by your doctor within one month. --------------

## 2023-05-07 NOTE — ED Provider Notes (Signed)
 [place] [facility] Provider Note   CSN: [id number] Arrival date & time: 05/07/23  1423     History  Chief Complaint  Patient presents with   Abdominal Pain    Laura Kramer is a 48 y.o. female.  Patient with history of cholecystectomy, hernia repair with mesh, diabetes -- presents to the emergency department today for evaluation of abdominal pain and vomiting.  Symptoms started yesterday morning.  Patient reports pain in the mid abdomen.  She has been able to tolerate ginger ale but has vomited at other times.  No change in bowel movements such as constipation, diarrhea or blood in the stool.  No urinary symptoms.  No chest pain or shortness of breath.  No treatments prior to arrival.       Home Medications Prior to Admission medications   Medication Sig Start Date End Date Taking? Authorizing Provider  cyclobenzaprine  (FLEXERIL ) 10 MG tablet Take 1 tablet (10 mg total) by mouth 2 (two) times daily as needed for muscle spasms. 04/06/22   Prospect Butter, PA  dapagliflozin propanediol (FARXIGA) 5 MG TABS tablet Take by mouth daily.    [provider]  diclofenac (CATAFLAM) 50 MG tablet Take 50 mg by mouth 2 (two) times daily.    [provider]  fluconazole (DIFLUCAN) 50 MG tablet Take 50 mg by mouth once a week.    [provider]  gabapentin  (NEURONTIN ) 300 MG capsule Take 600 mg by mouth 3 (three) times daily.    [provider]  glipiZIDE (GLUCOTROL XL) 10 MG 24 hr tablet Take 10 mg by mouth daily with breakfast.    [provider]  ibuprofen  (ADVIL ) 600 MG tablet Take 1 tablet (600 mg total) by mouth every 6 (six) hours. 06/17/21   [name], Ena Harries, MD  linaclotide  (LINZESS ) 290 MCG CAPS capsule Take 290 mcg by mouth daily before breakfast.    [provider]  oxyCODONE  (OXY IR/ROXICODONE ) 5 MG immediate release tablet Take 1 tablet (5 mg total) by mouth every 4 (four) hours as needed  for severe pain. 06/16/21   [name], Ena Harries, MD  phentermine (ADIPEX-P) 37.5 MG tablet Patient stated she will stop now until after 06/16/21 surgery. 08/06/17   [provider]  rizatriptan (MAXALT-MLT) 10 MG disintegrating tablet Take 10 mg by mouth as needed for migraine. May repeat in 2 hours if needed    [provider]  Semaglutide (OZEMPIC, 2 MG/DOSE, [place]) Inject into the skin.    [provider]      Allergies    Procardia [nifedipine], Ciprofloxacin, and Clindamycin /lincomycin    Review of Systems   Review of Systems  Physical Exam Updated Vital Signs BP (!) 143/112 (BP Location: Left Arm)   Pulse 94   Temp 98.1 F (36.7 C) (Oral)   Resp 20   Ht 5\' 3"  (1.6 m)   Wt 98.9 kg   LMP 05/22/2021   SpO2 100%   BMI 38.62 kg/m  Physical Exam Vitals and nursing note reviewed.  Constitutional:      General: She is not in acute distress.    Appearance: She is well-developed.  HENT:     Head: Normocephalic and atraumatic.     Right Ear: External ear normal.     Left Ear: External ear normal.     Nose: Nose normal.  Eyes:     Conjunctiva/sclera: Conjunctivae normal.  Cardiovascular:     Rate and Rhythm: Normal rate and regular rhythm.  Heart sounds: No murmur heard. Pulmonary:     Effort: No respiratory distress.     Breath sounds: No wheezing, rhonchi or rales.  Abdominal:     Palpations: Abdomen is soft.     Tenderness: There is abdominal tenderness (Mild) in the epigastric area and periumbilical area. There is no guarding or rebound. Negative signs include Murphy's sign and McBurney's sign.     Hernia: No hernia is present.  Musculoskeletal:     Cervical back: Normal range of motion and neck supple.     Right lower leg: No edema.     Left lower leg: No edema.  Skin:    General: Skin is warm and dry.     Findings: No rash.  Neurological:     General: No focal deficit present.     Mental Status: She is alert. Mental status is at baseline.      Motor: No weakness.  Psychiatric:        Mood and Affect: Mood normal.     ED Results / Procedures / Treatments   Labs (all labs ordered are listed, but only abnormal results are displayed) Labs Reviewed  COMPREHENSIVE METABOLIC PANEL WITH GFR - Abnormal; Notable for the following components:      Result Value   Sodium 134 (*)    Glucose, Bld 233 (*)    All other components within normal limits  URINALYSIS, ROUTINE W REFLEX MICROSCOPIC - Abnormal; Notable for the following components:   APPearance HAZY (*)    Glucose, UA 100 (*)    Leukocytes,Ua TRACE (*)    All other components within normal limits  URINALYSIS, MICROSCOPIC (REFLEX) - Abnormal; Notable for the following components:   Bacteria, UA MANY (*)    All other components within normal limits  LIPASE, BLOOD  CBC    EKG None  Radiology CT ABDOMEN PELVIS W CONTRAST Result Date: 05/07/2023 CLINICAL DATA:  Abdominal pain. EXAM: CT ABDOMEN AND PELVIS WITH CONTRAST TECHNIQUE: Multidetector CT imaging of the abdomen and pelvis was performed using the standard protocol following bolus administration of intravenous contrast. RADIATION DOSE REDUCTION: This exam was performed according to the departmental dose-optimization program which includes automated exposure control, adjustment of the mA and/or kV according to patient size and/or use of iterative reconstruction technique. CONTRAST:  OMNIPAQUE  IOHEXOL  300 MG/ML  SOLN COMPARISON:  None Available. FINDINGS: Lower chest: The visualized lung bases are clear. No intra-abdominal free air or free fluid. Hepatobiliary: Fatty liver. No biliary ductal dilatation. Cholecystectomy. No retained calcified stone noted in the central CBD. Pancreas: Unremarkable. No pancreatic ductal dilatation or surrounding inflammatory changes. Spleen: Normal in size without focal abnormality. Adrenals/Urinary Tract: The adrenal glands unremarkable. The kidneys, visualized ureters, and urinary bladder appear  unremarkable. Stomach/Bowel: There is no bowel obstruction or active inflammation. The appendix is normal. Vascular/Lymphatic: The abdominal aorta and IVC are unremarkable. No portal venous gas. There is no adenopathy. Reproductive: Hysterectomy. No suspicious adnexal masses. A 15 mm left ovarian corpus luteum. No imaging follow-up. Other: Ventral hernia repair mesh. Musculoskeletal: No acute osseous pathology. IMPRESSION: 1. No acute intra-abdominal or pelvic pathology. 2. Fatty liver. Electronically Signed   By: Angus Bark M.D.   On: 05/07/2023 17:00    Procedures Procedures    Medications Ordered in ED Medications - No data to display  ED Course/ Medical Decision Making/ A&P    Patient seen and examined. History obtained directly from patient. Work-up including labs, imaging, EKG ordered in triage, if performed, were  reviewed.    Labs/EKG: Independently reviewed and interpreted.  This included: CBC with borderline high white blood cell count otherwise unremarkable; CMP glucose 233 without elevated anion gap; lipase normal.  UA pending.  Imaging: Ordered CT abdomen and pelvis with contrast.  Medications/Fluids: Ordered: Dilaudid /Zofran , fluid bolus  Most recent vital signs reviewed and are as follows: BP (!) 143/112 (BP Location: Left Arm)   Pulse 94   Temp 98.1 F (36.7 C) (Oral)   Resp 20   Ht 5\' 3"  (1.6 m)   Wt 98.9 kg   LMP 05/22/2021   SpO2 100%   BMI 38.62 kg/m   Initial impression: Mid abdominal pain, history of hernia surgery, will evaluate for hernia, obstruction, infection.  5:33 PM Reassessment performed. Patient appears stable.  States feeling sleepy from the pain medication, however pain has not improved.  Labs personally reviewed and interpreted including: UA with a few white cells, not overly compelling for UTI given the patient's lack of urinary symptoms.  Imaging personally visualized and interpreted including: CT scan of the abdomen pelvis, agree no  acute findings.  Reviewed pertinent lab work and imaging with patient at bedside. Questions answered.   Most current vital signs reviewed and are as follows: BP (!) 143/112 (BP Location: Left Arm)   Pulse 94   Temp 98.1 F (36.7 C) (Oral)   Resp 20   Ht 5\' 3"  (1.6 m)   Wt 98.9 kg   LMP 05/22/2021   SpO2 100%   BMI 38.62 kg/m   Plan: Will give Toradol , p.o. challenge.  6:29 PM Reassessment performed. Patient appears stable.  She is drinking fluids without vomiting.  Reviewed pertinent lab work and imaging with patient at bedside. Questions answered.   Most current vital signs reviewed and are as follows: BP (!) 143/112 (BP Location: Left Arm)   Pulse 94   Temp 98.1 F (36.7 C) (Oral)   Resp 20   Ht 5\' 3"  (1.6 m)   Wt 98.9 kg   LMP 05/22/2021   SpO2 100%   BMI 38.62 kg/m   Plan: Discharge to home.   Prescriptions written for: Oxycodone  # 6 tablets, Zofran , Colace  Other home care instructions discussed: Bland diet, maintain good hydration  ED return instructions discussed: The patient was urged to return to the Emergency Department immediately with worsening of current symptoms, worsening abdominal pain, persistent vomiting, blood noted in stools, fever, or any other concerns. The patient verbalized understanding.   Follow-up instructions discussed: Patient encouraged to follow-up with their GI as planned in 2 days.  Patient states that she has an appointment set up on 5/7 at 10 AM.                                   Medical Decision Making Amount and/or Complexity of Data Reviewed Labs: ordered. Radiology: ordered.  Risk OTC drugs. Prescription drug management.   For this patient's complaint of abdominal pain, the following conditions were considered on the differential diagnosis: gastritis/PUD, enteritis/duodenitis, appendicitis, cholelithiasis/cholecystitis, cholangitis, pancreatitis, ruptured viscus, colitis, diverticulitis, small/large bowel obstruction,  proctitis, cystitis, pyelonephritis, ureteral colic, aortic dissection, aortic aneurysm. In women, pelvic inflammatory disease, ovarian cysts, and tubo-ovarian abscess were also considered. Atypical chest etiologies were also considered including ACS, PE, and pneumonia.  Workup and labs reassuring.  Glucose slightly elevated but no signs of DKA.  Do not suspect UTI without irritative UTI symptoms.  Pain is mainly  mid upper abdomen.  CT scan reassuring.  Patient tolerating oral fluids after treatment in ED.  The patient's vital signs, pertinent lab work and imaging were reviewed and interpreted as discussed in the ED course. Hospitalization was considered for further testing, treatments, or serial exams/observation. However as patient is well-appearing, has a stable exam, and reassuring studies today, I do not feel that they warrant admission at this time. This plan was discussed with the patient who verbalizes agreement and comfort with this plan and seems reliable and able to return to the Emergency Department with worsening or changing symptoms.           Final Clinical Impression(s) / ED Diagnoses Final diagnoses:  Upper abdominal pain    Rx / DC Orders ED Discharge Orders          Ordered    oxyCODONE  (OXY IR/ROXICODONE ) 5 MG immediate release tablet  Every 6 hours PRN        05/07/23 1828    ondansetron  (ZOFRAN -ODT) 4 MG disintegrating tablet  Every 8 hours PRN        05/07/23 1828    docusate sodium  (COLACE) 100 MG capsule  2 times daily PRN        05/07/23 1828              Lyna Sandhoff, PA-C 05/07/23 1830    Merdis Stalling, MD 05/07/23 [phone]

## 2023-05-07 NOTE — ED Triage Notes (Addendum)
 Pt POV slow gait- c/o abd pain since yesterday, hx of hernia mesh placed in 2017, repaired last year. Reports a knot in mid abdomen during certain movement. Reports increased pain after eating.   Emesis since yesterday. Denies diarrhea. No known sick contacts, denies fever.   Hydrocodone  taken appx 2 hr pta.   Pt reports not taking bp meds today due to pain.

## 2023-11-26 ENCOUNTER — Other Ambulatory Visit: Payer: Self-pay | Admitting: Obstetrics and Gynecology

## 2023-11-26 DIAGNOSIS — N644 Mastodynia: Secondary | ICD-10-CM

## 2023-12-17 ENCOUNTER — Inpatient Hospital Stay
Admission: RE | Admit: 2023-12-17 | Discharge: 2023-12-17 | Attending: Obstetrics and Gynecology | Admitting: Obstetrics and Gynecology

## 2023-12-17 DIAGNOSIS — N644 Mastodynia: Secondary | ICD-10-CM

## 2023-12-28 ENCOUNTER — Encounter
